# Patient Record
Sex: Male | Born: 1960 | Race: White | Hispanic: No | Marital: Married | Smoking: Never smoker
Health system: Southern US, Community
[De-identification: ages and names within clinical notes are randomized; demographics above are authoritative.]

## PROBLEM LIST (undated history)

## (undated) DIAGNOSIS — F329 Major depressive disorder, single episode, unspecified: Secondary | ICD-10-CM

## (undated) DIAGNOSIS — F32A Depression, unspecified: Secondary | ICD-10-CM

## (undated) HISTORY — PX: WISDOM TOOTH EXTRACTION: SHX21

## (undated) HISTORY — DX: Depression, unspecified: F32.A

## (undated) HISTORY — PX: VASECTOMY: SHX75

## (undated) HISTORY — DX: Major depressive disorder, single episode, unspecified: F32.9

---

## 1998-09-01 ENCOUNTER — Encounter: Payer: Self-pay | Admitting: Family Medicine

## 1998-09-01 ENCOUNTER — Ambulatory Visit (HOSPITAL_COMMUNITY): Admission: RE | Admit: 1998-09-01 | Discharge: 1998-09-01 | Payer: Self-pay | Admitting: Family Medicine

## 2003-08-28 HISTORY — PX: LASIK: SHX215

## 2004-04-12 ENCOUNTER — Ambulatory Visit (HOSPITAL_COMMUNITY): Admission: RE | Admit: 2004-04-12 | Discharge: 2004-04-12 | Payer: Self-pay | Admitting: Family Medicine

## 2005-04-06 IMAGING — US US SCROTUM
1 series · 14 of 25 positions shown · non-contrast
Comparison: none

CLINICAL DATA: Right scrotal mass.  History of vasectomy 3 years ago. 
 SCROTAL ULTRASOUND
 No comparison. 
 Both testes are normal in size and echogenicity.  The right testis measures 4.8 x 2.8 x 3.2 cm and the left testis 4.0 x 2.6 x 3.6 cm.  There is normal blood flow within both testes on color Doppler.  Prominent rete testis is noted incidentally on the right.  Both epididymides appear normal.  There are small right greater than left hydroceles.  Within the right testis, there is a small calcification measuring up to 3.5 mm in diameter.  This corresponds with the patient's palpable concern.  Small right-sided varicocele is noted incidentally.  
 IMPRESSION
 Right scrotal calcification (scrotal pearl) corresponds with the patient's palpable concern.   There are small right greater than left hydroceles.
 Both testes appear normal.

[Series 1: unknown · 0.09mm/px · 14 of 56 slices shown]
[im 1/56]
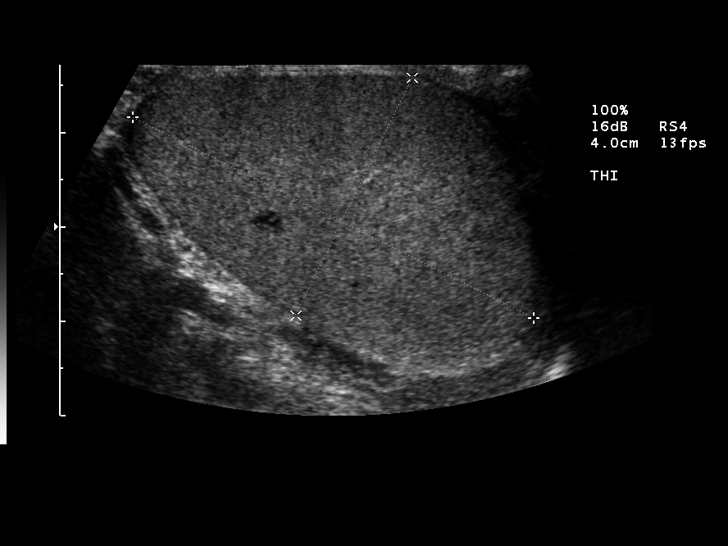
[im 5/56]
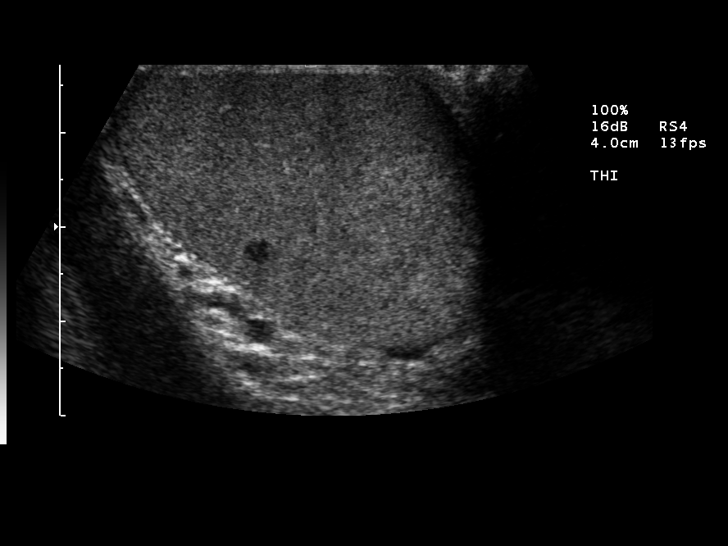
[im 10/56]
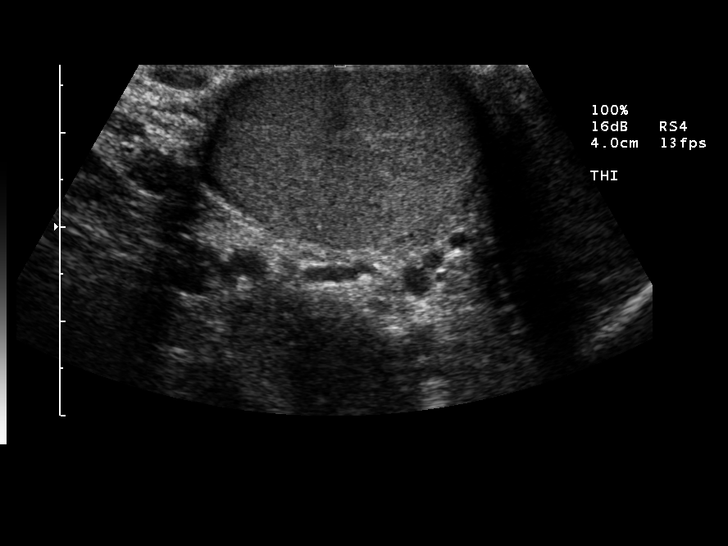
[im 14/56]
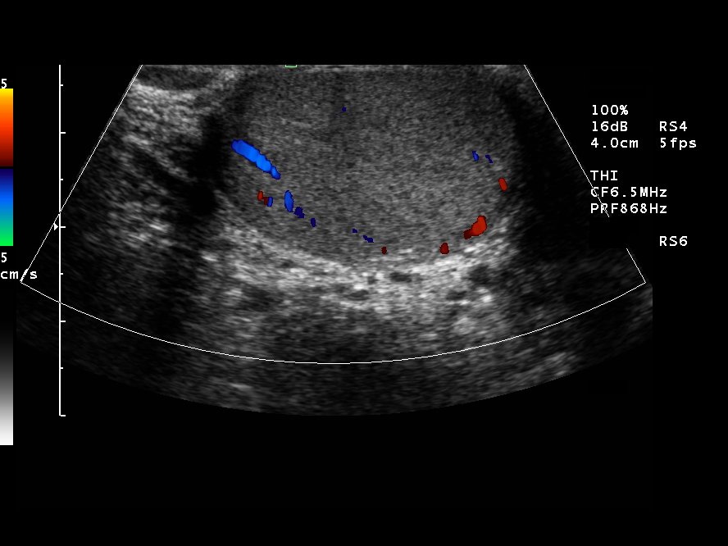
[im 19/56]
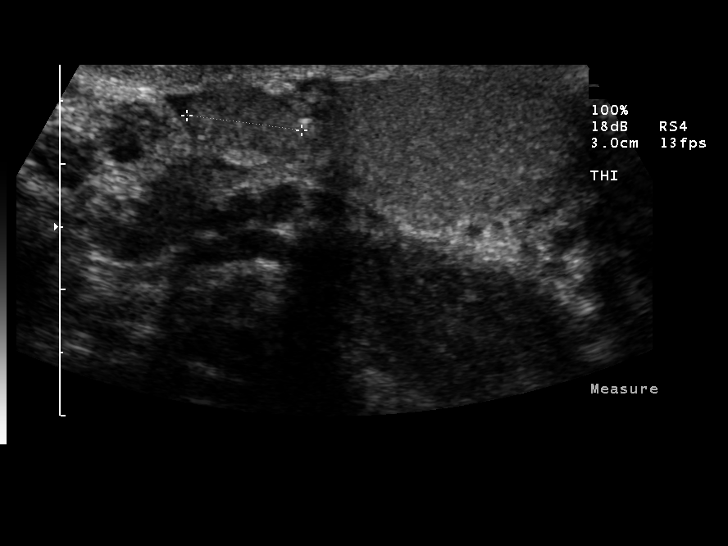
[im 21/56]
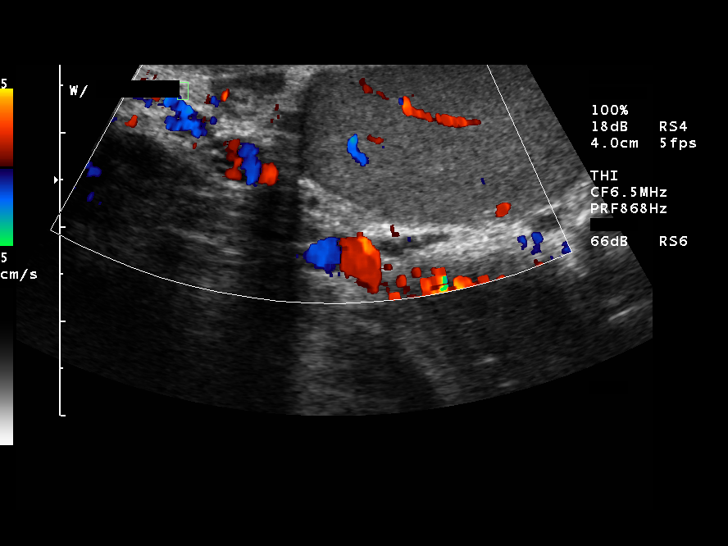
[im 26/56]
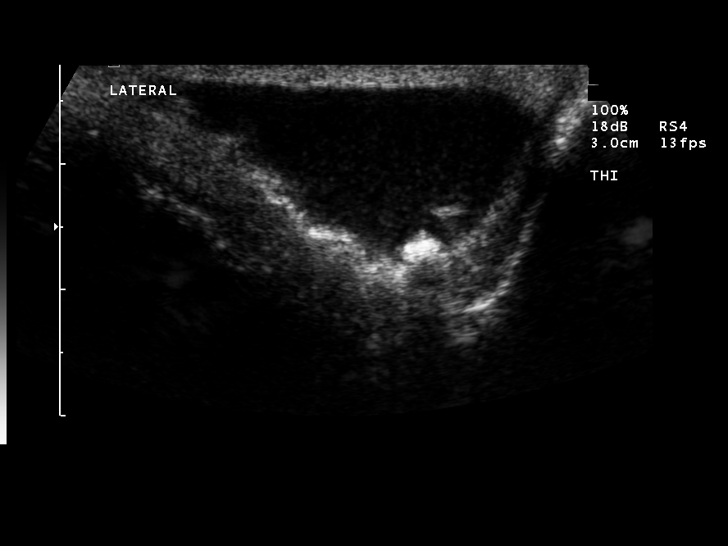
[im 30/56]
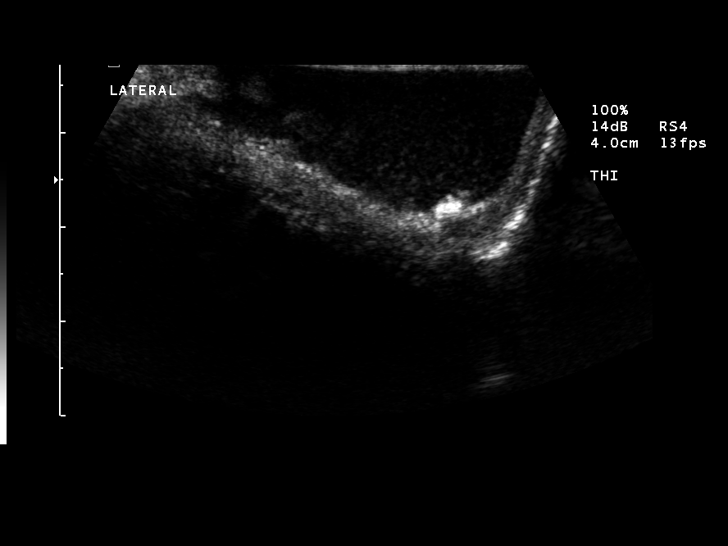
[im 35/56]
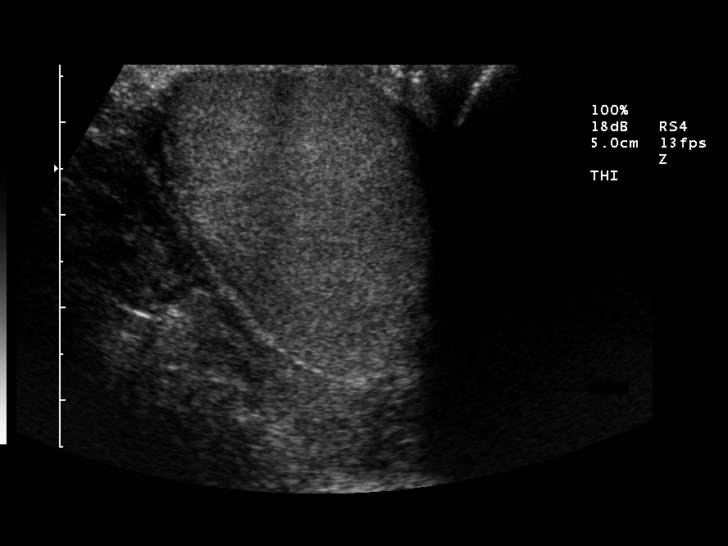
[im 37/56]
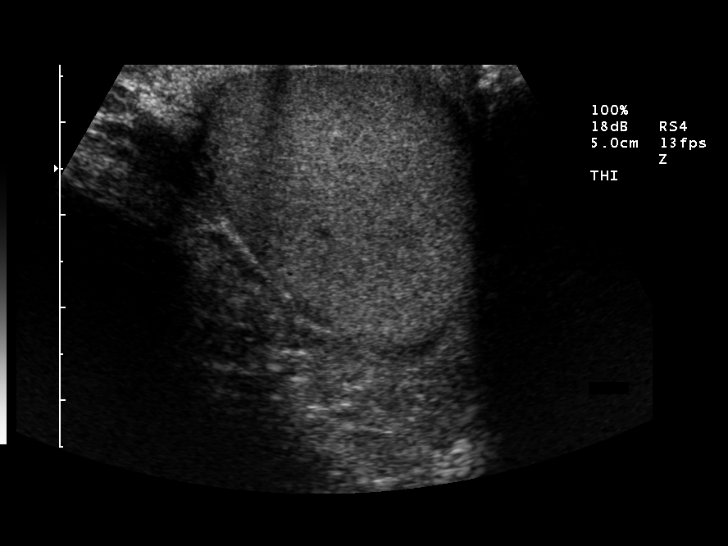
[im 42/56]
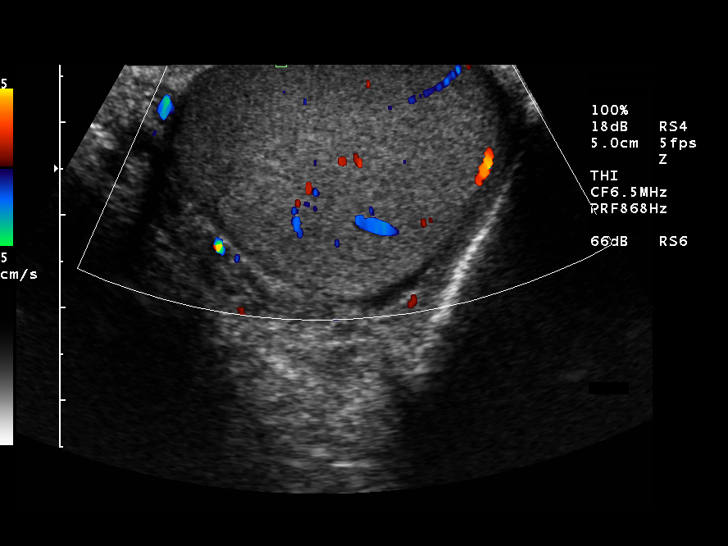
[im 46/56]
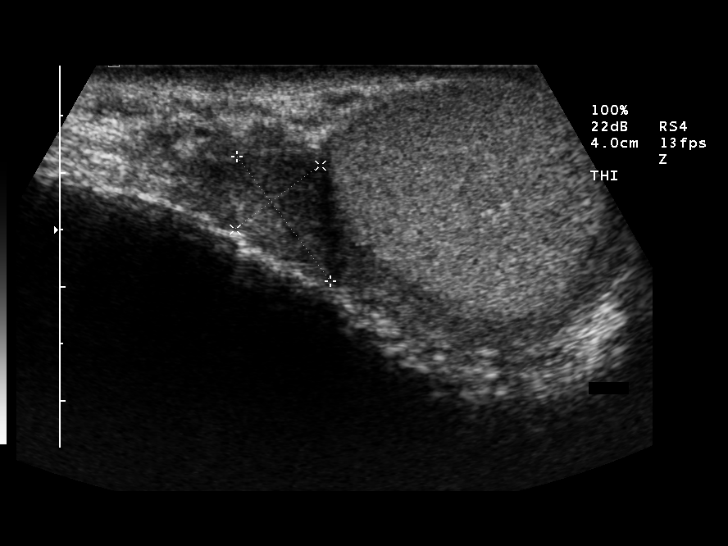
[im 51/56]
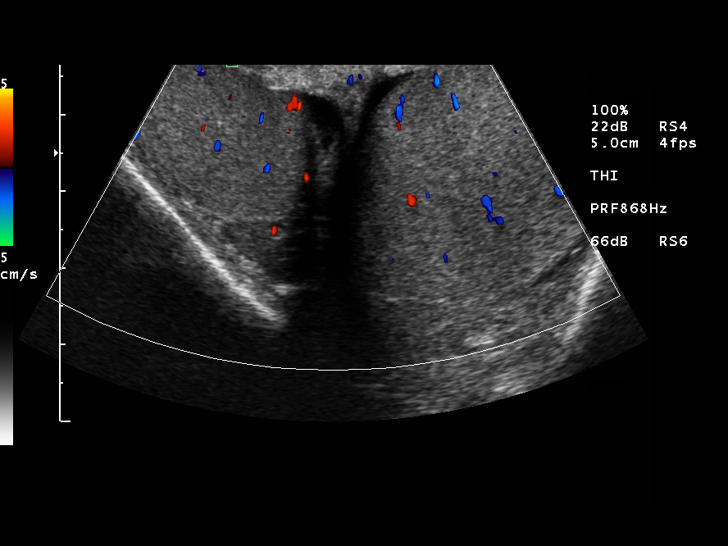
[im 56/56]
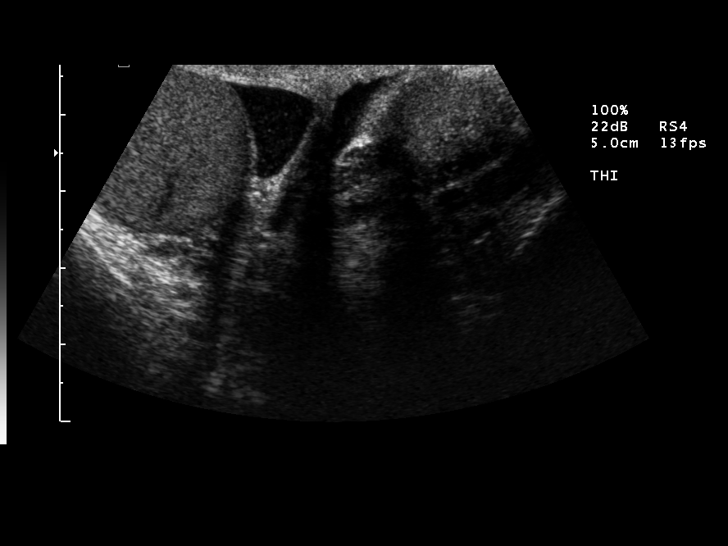

[14 of 25 positions shown; findings below may reference images not displayed]

## 2009-08-19 IMAGING — CR DG FINGERS 2V UNILAT - NO REPORT
1 series · 3 of 3 positions shown · non-contrast
Comparison: NONE

CLINICAL DATA: Splinter for 3 weeks. Evaluate for cellulitis. 
Pain  and swelling. 

RIGHT FOURTH FINGER

[Series 1: view not recorded · 0.17mm/px · 3 of 3 slices shown]
[im 1/3]
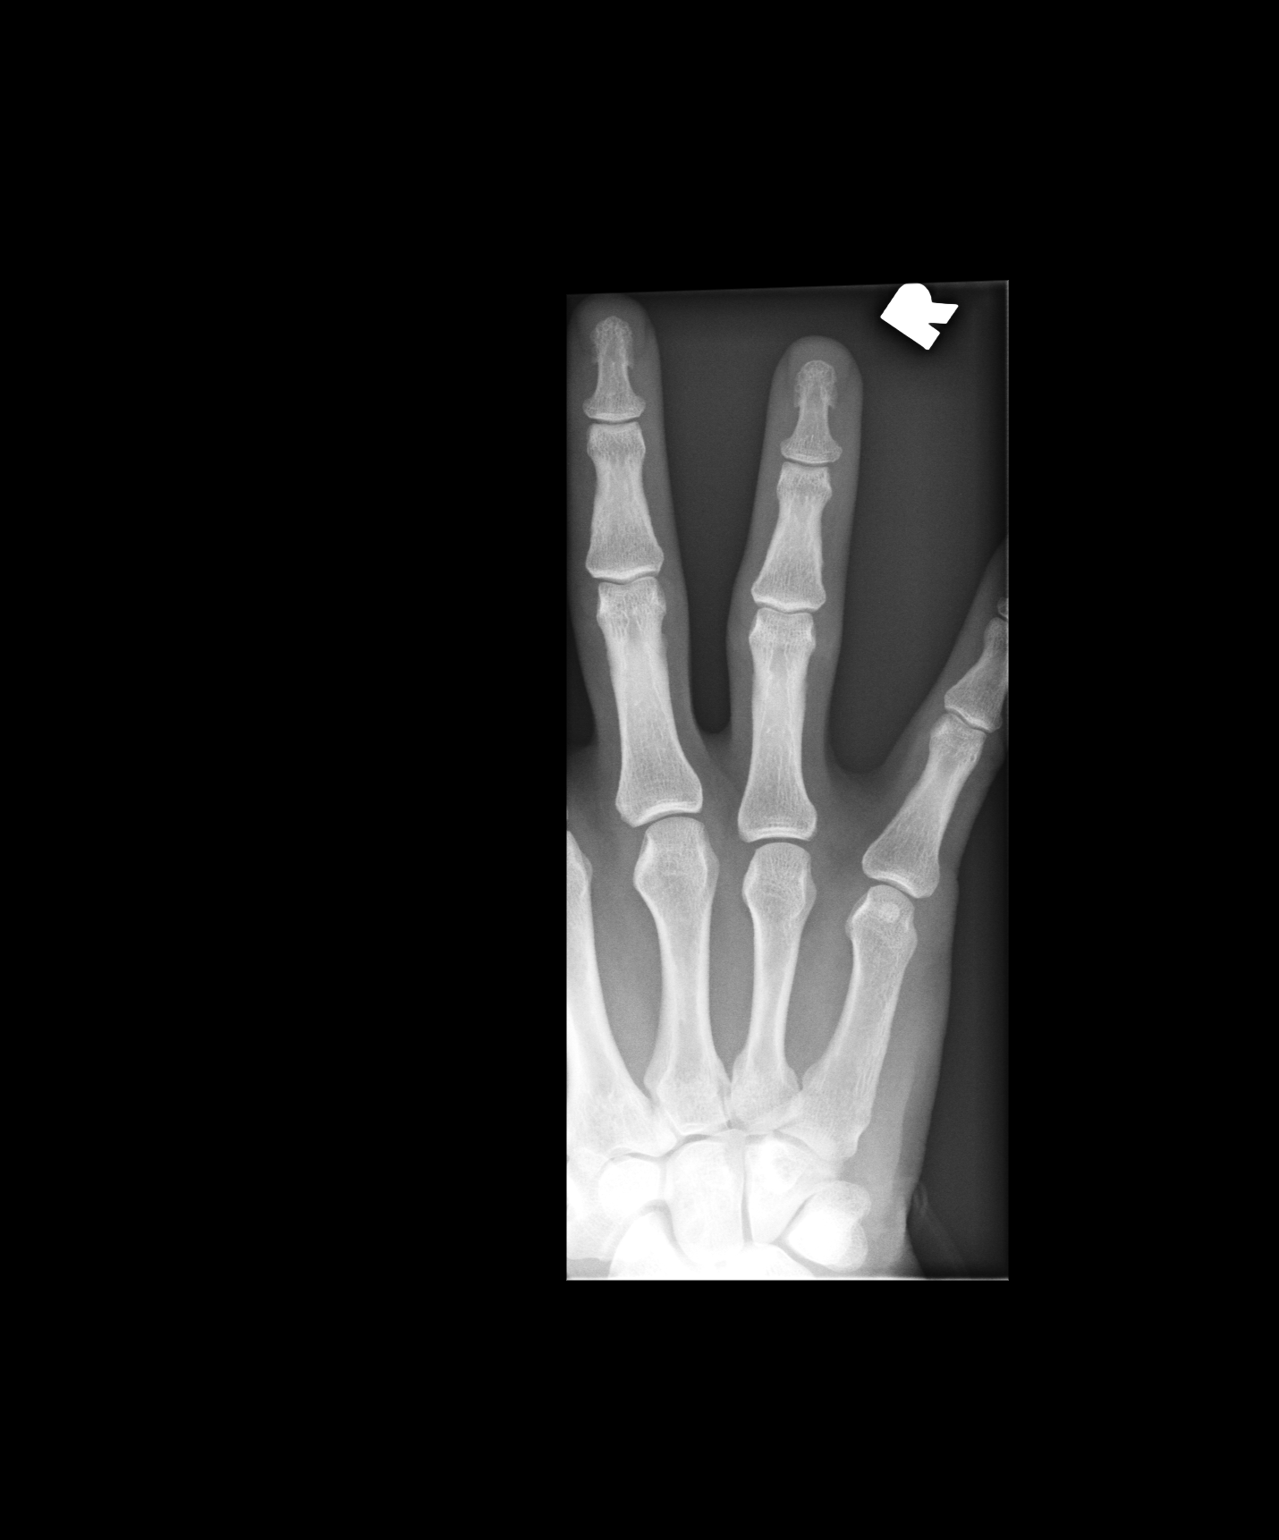
[im 2/3]
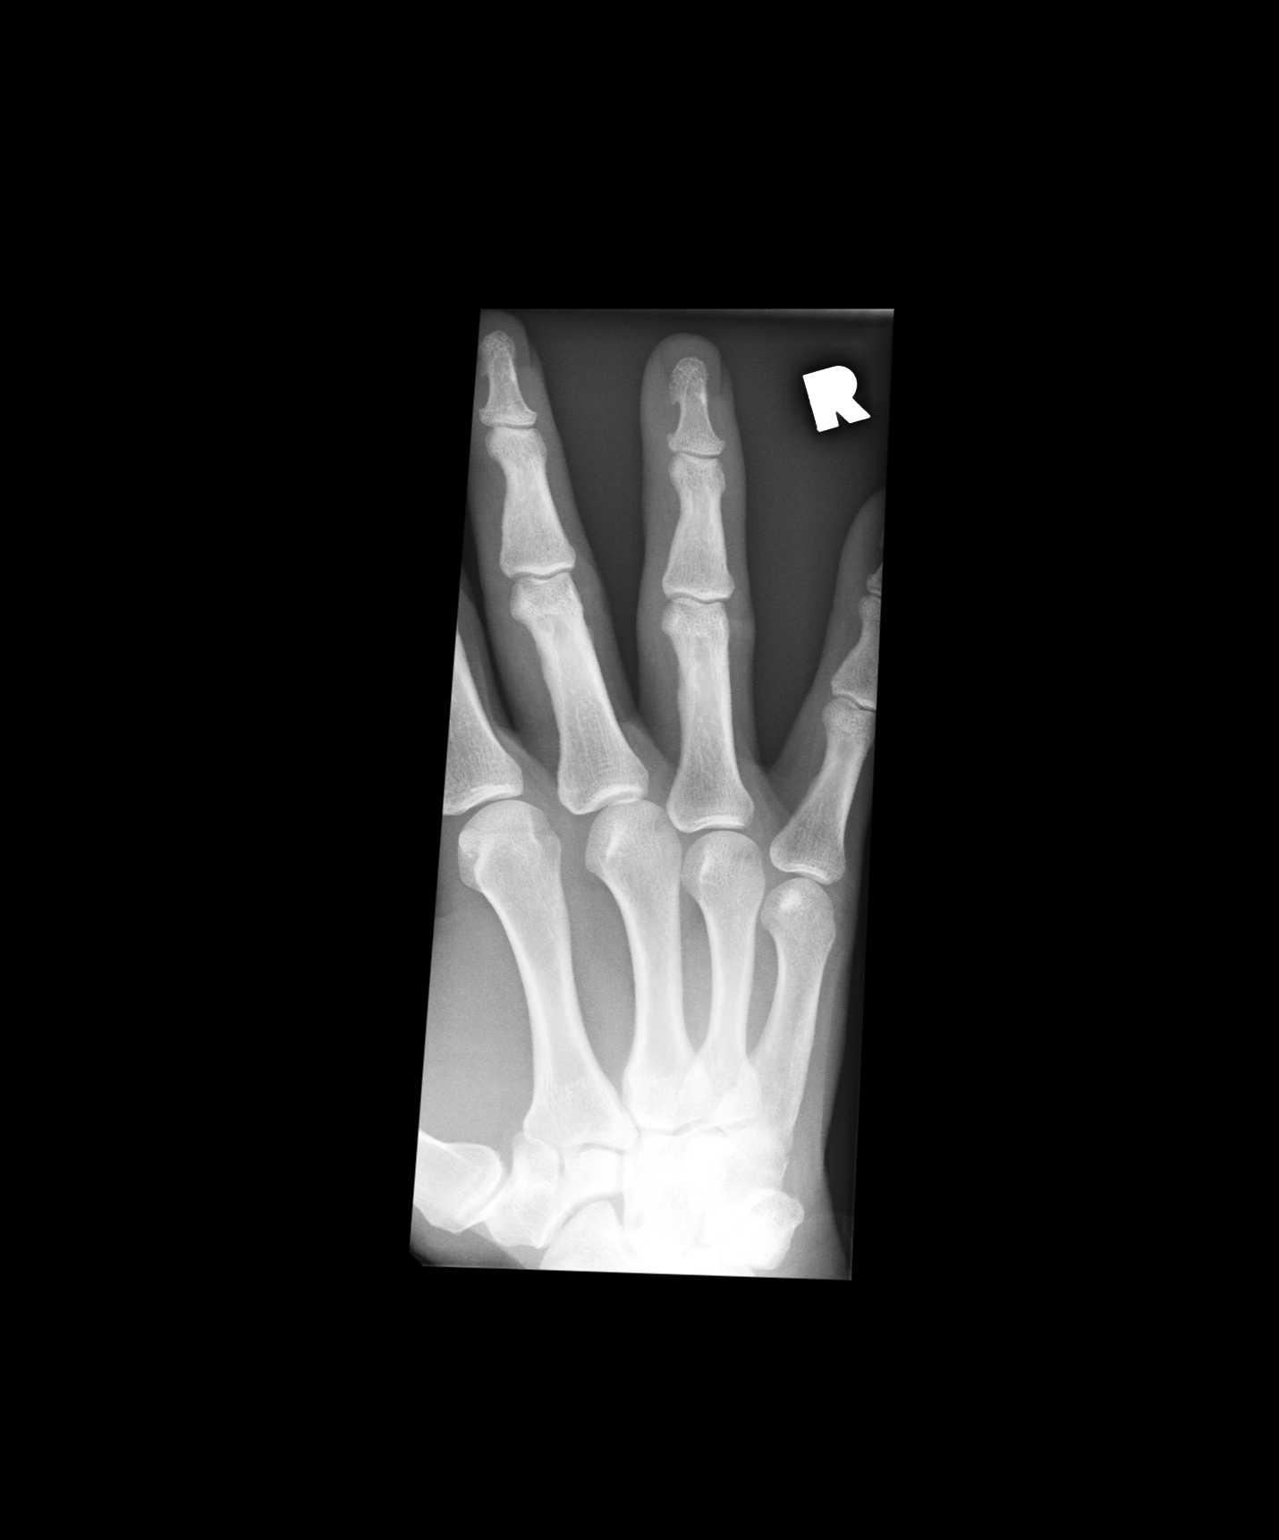
[im 3/3]
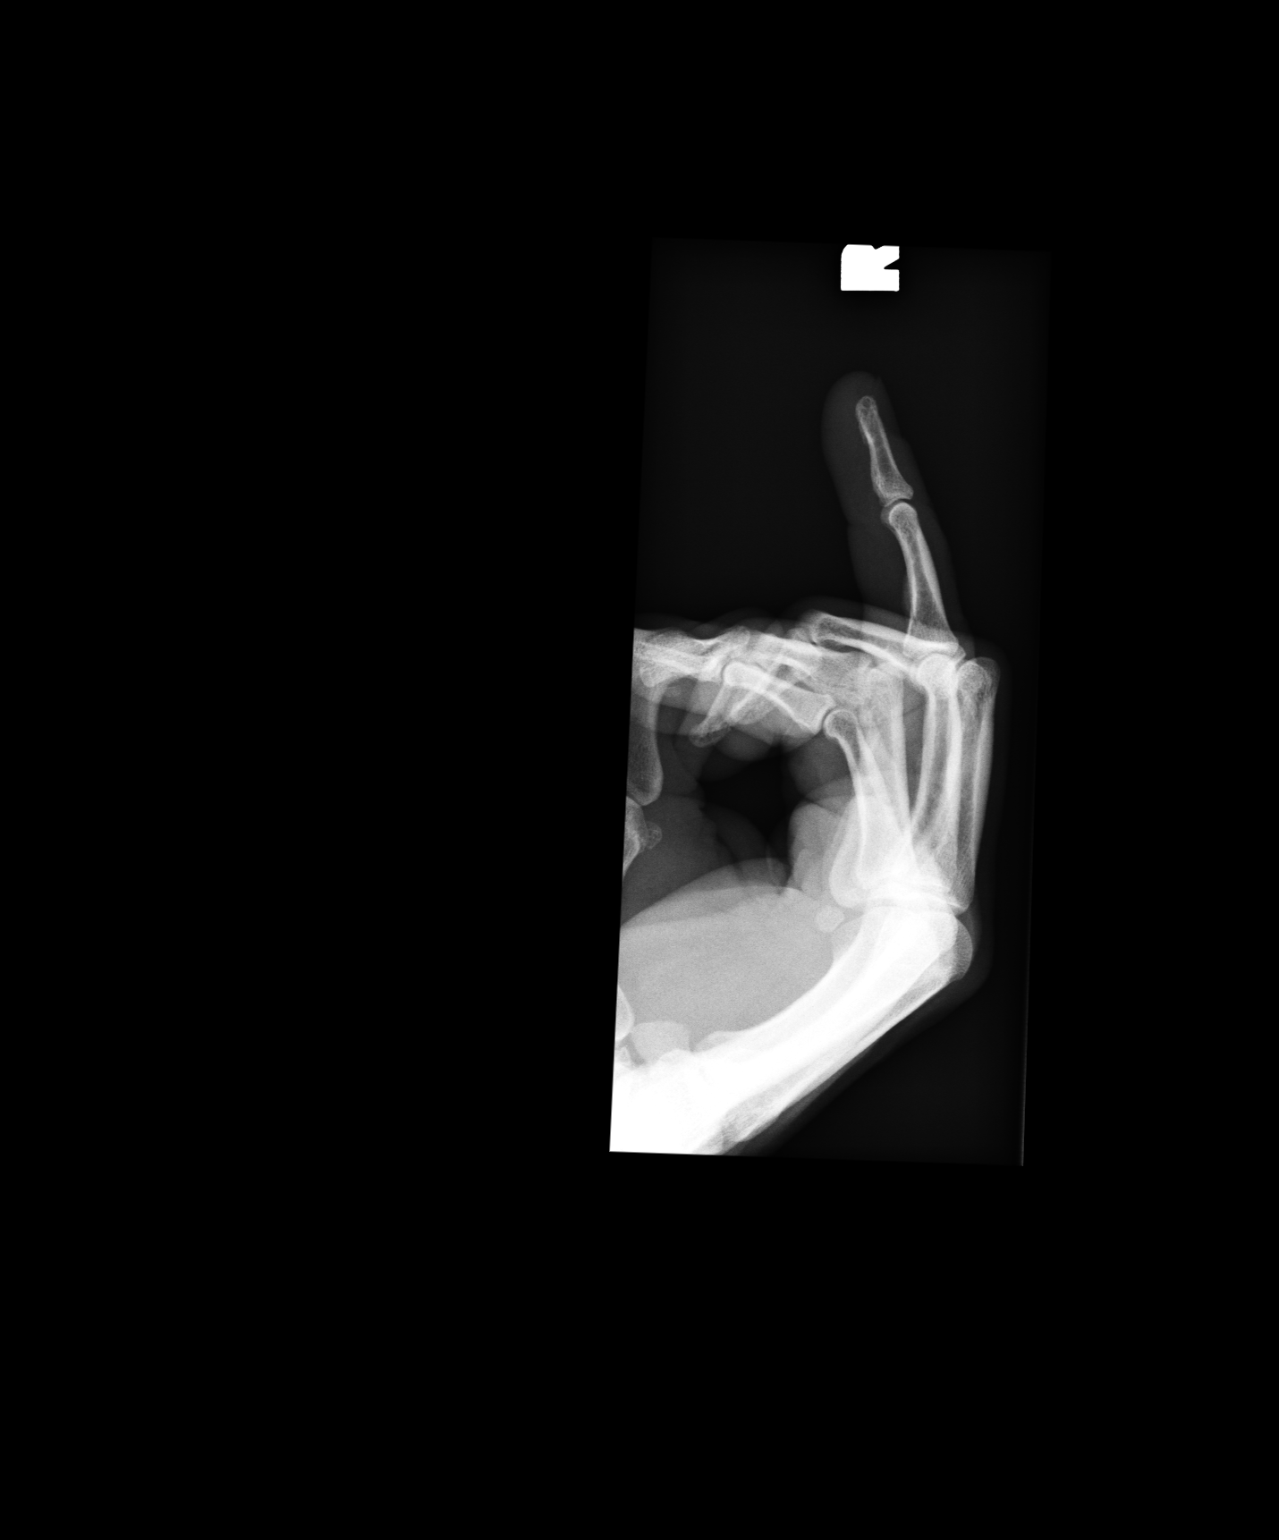

[3 of 3 positions shown; findings below may reference images not displayed]

FINDINGS: Views of the right fourth finger demonstrate no 
evidence of fracture, dislocation, soft tissue abnormality, or 
changes suggesting erosive or degenerative arthritis.
IMPRESSION: Normal. No radiopaque foreign body is identified. 
Date: 08/26/2008  Tran Date:  08/26/2008 DAS  [REDACTED]

## 2013-07-17 ENCOUNTER — Encounter: Payer: Self-pay | Admitting: Podiatrist

## 2013-07-17 ENCOUNTER — Ambulatory Visit (INDEPENDENT_AMBULATORY_CARE_PROVIDER_SITE_OTHER): Payer: Federal, State, Local not specified - PPO | Admitting: Podiatrist

## 2013-07-17 VITALS — BP 113/72 | HR 58 | Resp 16 | Ht 70.0 in | Wt 165.0 lb

## 2013-07-17 DIAGNOSIS — B351 Tinea unguium: Secondary | ICD-10-CM

## 2013-07-17 NOTE — Progress Notes (Addendum)
  MRN: [id number] Name: Dakota Flores  Sex: male Age: 52 y.o. DOB: 03-May-1961  Provider: Marlowe Aschoff P  Allergies: Review of patient's allergies indicates no known allergies.   Chief Complaint  Patient presents with  . Nail Problem    Est Pt 07/16/13 LOV  Fungal Nails B/L;  "Toenail fungus, I just can't seem to get rid of it."     HPI: Patient is 52 y.o. male who presents today for toenail fungus bilateral. Patient states "I have this toenail fungus and just can't seem to get rid of it" patient states he was on four months of Lamisil in the past and it failed to resolve the symptomatic onychomycosis.   Past Medical History  Diagnosis Date  . Depression        Medication List       This list is accurate as of: 07/17/13 11:59 PM.  Always use your most recent med list.               sertraline 50 MG tablet  Commonly known as:  ZOLOFT  Take 50 mg by mouth daily.         Past Surgical History  Procedure Laterality Date  . Vasectomy    . Wisdom tooth extraction         Review of Systems  DATA OBTAINED: from patient intake form GENERAL: Feels well no fevers, no fatigue, no changes in appetite SKIN: No itching, no rashes, no open lesions, no wounds EYES: No eye pain,no redness, no discharge EARS: No earache,no ringing of ears, no recent change in hearing NOSE: No congestion, no drainage, no bleeding  MOUTH/THROAT: No mouth pain, No sore throat, No difficulty chewing or swallowing  RESPIRATORY: No cough, no wheezing, no SOB CARDIAC: No chest pain,no heart palpitations,no new onset lower extremity edema  GI: No abdominal pain, No Nausea, no vomiting, no diarrhea, no heartburn or no reflux  GU: No dysuria, no increased frequency or urgency MUSCULOSKELETAL: No unrelieved bone/joint pain,  NEUROLOGIC: Awake, alert, appropriate to situation, No change in mental status. PSYCHIATRIC: No overt anxiety or sadness.No behavior issue.  AMBULATION:  Ambulates  unassisted  Filed Vitals:   07/17/13 1535  BP: 113/72  Pulse: 58  Resp: 16    Physical Exam  GENERAL APPEARANCE: Alert, conversant. Appropriately groomed. No acute distress.  VASCULAR: Pedal pulses palpable and strong bilateral.  Capillary refill time is immediate to all digits,  Proximal to distal cooling it warm to warm.  Digital hair growth is present bilateral  NEUROLOGIC: sensation is intact epicritically and protectively to 5.07 monofilament at 5/5 sites bilateral.  Light touch is intact bilateral, vibratory sensation intact bilateral, achilles tendon reflex is intact bilateral.  MUSCULOSKELETAL: acceptable muscle strength, tone and stability bilateral.  Intrinsic muscluature intact bilateral.  Rectus appearance of foot and digits noted bilateral.   DERMATOLOGIC: skin color, texture, and turger are within normal limits. Patient's toenails are symptomatic with onychomycosis present. Yellowish brownish discoloration and thickening of the nail is seen. Superficial white fungus appears to be present.   Assessment  Superficial onychomycosis   Plan Sample of the nail was in the [facility]. I am starting him on Jublia until we get the results of the culture. We'll notify him of the results and we'll most likely put him on onmel or Diflucan.   Delories Heinz, DPM

## 2013-07-17 NOTE — Patient Instructions (Signed)
We will contact you with the results of your culture to decide on treatment options.  untli then, use the jublia.

## 2013-07-20 ENCOUNTER — Telehealth: Payer: Self-pay | Admitting: *Deleted

## 2013-07-20 MED ORDER — EFINACONAZOLE 10 % EX SOLN: 1.0000 [drp] | CUTANEOUS | Status: DC

## 2013-07-20 NOTE — Telephone Encounter (Signed)
B/L1st toenail fragments sent to [facility] [phone] for histopathology PAS,GMS,FM and culture in addition to other stains.

## 2013-07-20 NOTE — Telephone Encounter (Signed)
Pt states he would like the rx Jublia called in.  Dr Faylene Million notes state she will prescribe Jublia if requested.  I will order the Jublia from [facility] and inform pt to activate the card.  Done.

## 2013-09-07 ENCOUNTER — Telehealth: Payer: Self-pay | Admitting: *Deleted

## 2013-09-07 NOTE — Telephone Encounter (Signed)
Left message that the nail cultures were negative.  Dr.Egerton states continue the Jublia for 6 months, if no improvement may want to talk about an oral antifungal medication, in case of a false negative.

## 2013-09-24 ENCOUNTER — Telehealth: Payer: Self-pay | Admitting: *Deleted

## 2013-09-24 NOTE — Telephone Encounter (Signed)
Left message with DR Egerton's statement of negative fungal results, may stem from Lamisil use, can continue Jublia for 1 - 2 months check progress, or use Omnel, which requires Liver Function and CBC with Diff prior to treatment.

## 2013-09-25 ENCOUNTER — Encounter: Payer: Self-pay | Admitting: Podiatrist

## 2016-07-04 DIAGNOSIS — H25013 Cortical age-related cataract, bilateral: Secondary | ICD-10-CM | POA: Diagnosis not present

## 2016-07-04 DIAGNOSIS — H2513 Age-related nuclear cataract, bilateral: Secondary | ICD-10-CM | POA: Diagnosis not present

## 2016-07-04 DIAGNOSIS — H40013 Open angle with borderline findings, low risk, bilateral: Secondary | ICD-10-CM | POA: Diagnosis not present

## 2016-07-04 DIAGNOSIS — H43393 Other vitreous opacities, bilateral: Secondary | ICD-10-CM | POA: Diagnosis not present

## 2016-07-12 DIAGNOSIS — R52 Pain, unspecified: Secondary | ICD-10-CM | POA: Diagnosis not present

## 2016-07-12 DIAGNOSIS — J111 Influenza due to unidentified influenza virus with other respiratory manifestations: Secondary | ICD-10-CM | POA: Diagnosis not present

## 2016-12-11 DIAGNOSIS — M461 Sacroiliitis, not elsewhere classified: Secondary | ICD-10-CM | POA: Diagnosis not present

## 2016-12-11 DIAGNOSIS — M9905 Segmental and somatic dysfunction of pelvic region: Secondary | ICD-10-CM | POA: Diagnosis not present

## 2016-12-11 DIAGNOSIS — M5135 Other intervertebral disc degeneration, thoracolumbar region: Secondary | ICD-10-CM | POA: Diagnosis not present

## 2016-12-11 DIAGNOSIS — M9902 Segmental and somatic dysfunction of thoracic region: Secondary | ICD-10-CM | POA: Diagnosis not present

## 2016-12-17 DIAGNOSIS — M545 Low back pain: Secondary | ICD-10-CM | POA: Diagnosis not present

## 2016-12-26 DIAGNOSIS — M545 Low back pain: Secondary | ICD-10-CM | POA: Diagnosis not present

## 2016-12-27 DIAGNOSIS — M461 Sacroiliitis, not elsewhere classified: Secondary | ICD-10-CM | POA: Diagnosis not present

## 2016-12-27 DIAGNOSIS — M9902 Segmental and somatic dysfunction of thoracic region: Secondary | ICD-10-CM | POA: Diagnosis not present

## 2016-12-27 DIAGNOSIS — M5135 Other intervertebral disc degeneration, thoracolumbar region: Secondary | ICD-10-CM | POA: Diagnosis not present

## 2016-12-27 DIAGNOSIS — M9905 Segmental and somatic dysfunction of pelvic region: Secondary | ICD-10-CM | POA: Diagnosis not present

## 2017-01-01 DIAGNOSIS — H40013 Open angle with borderline findings, low risk, bilateral: Secondary | ICD-10-CM | POA: Diagnosis not present

## 2017-01-01 DIAGNOSIS — H524 Presbyopia: Secondary | ICD-10-CM | POA: Diagnosis not present

## 2017-03-20 DIAGNOSIS — L821 Other seborrheic keratosis: Secondary | ICD-10-CM | POA: Diagnosis not present

## 2017-03-20 DIAGNOSIS — L814 Other melanin hyperpigmentation: Secondary | ICD-10-CM | POA: Diagnosis not present

## 2017-03-20 DIAGNOSIS — D225 Melanocytic nevi of trunk: Secondary | ICD-10-CM | POA: Diagnosis not present

## 2017-03-20 DIAGNOSIS — D1801 Hemangioma of skin and subcutaneous tissue: Secondary | ICD-10-CM | POA: Diagnosis not present

## 2017-05-21 DIAGNOSIS — K080 Exfoliation of teeth due to systemic causes: Secondary | ICD-10-CM | POA: Diagnosis not present

## 2017-05-27 DIAGNOSIS — R631 Polydipsia: Secondary | ICD-10-CM | POA: Diagnosis not present

## 2017-05-27 DIAGNOSIS — F419 Anxiety disorder, unspecified: Secondary | ICD-10-CM | POA: Diagnosis not present

## 2017-05-27 DIAGNOSIS — R51 Headache: Secondary | ICD-10-CM | POA: Diagnosis not present

## 2017-05-27 DIAGNOSIS — Z23 Encounter for immunization: Secondary | ICD-10-CM | POA: Diagnosis not present

## 2017-06-10 DIAGNOSIS — F418 Other specified anxiety disorders: Secondary | ICD-10-CM | POA: Diagnosis not present

## 2017-07-09 DIAGNOSIS — H2513 Age-related nuclear cataract, bilateral: Secondary | ICD-10-CM | POA: Diagnosis not present

## 2017-07-09 DIAGNOSIS — H25013 Cortical age-related cataract, bilateral: Secondary | ICD-10-CM | POA: Diagnosis not present

## 2017-07-09 DIAGNOSIS — H43393 Other vitreous opacities, bilateral: Secondary | ICD-10-CM | POA: Diagnosis not present

## 2017-07-09 DIAGNOSIS — H40013 Open angle with borderline findings, low risk, bilateral: Secondary | ICD-10-CM | POA: Diagnosis not present

## 2017-07-10 DIAGNOSIS — Z125 Encounter for screening for malignant neoplasm of prostate: Secondary | ICD-10-CM | POA: Diagnosis not present

## 2017-07-10 DIAGNOSIS — E7800 Pure hypercholesterolemia, unspecified: Secondary | ICD-10-CM | POA: Diagnosis not present

## 2017-07-12 DIAGNOSIS — F418 Other specified anxiety disorders: Secondary | ICD-10-CM | POA: Diagnosis not present

## 2017-07-12 DIAGNOSIS — F5232 Male orgasmic disorder: Secondary | ICD-10-CM | POA: Diagnosis not present

## 2017-07-12 DIAGNOSIS — Z0000 Encounter for general adult medical examination without abnormal findings: Secondary | ICD-10-CM | POA: Diagnosis not present

## 2017-07-22 DIAGNOSIS — F331 Major depressive disorder, recurrent, moderate: Secondary | ICD-10-CM | POA: Diagnosis not present

## 2017-08-27 HISTORY — PX: COLONOSCOPY: SHX174

## 2017-08-30 DIAGNOSIS — N522 Drug-induced erectile dysfunction: Secondary | ICD-10-CM | POA: Diagnosis not present

## 2017-08-31 DIAGNOSIS — F330 Major depressive disorder, recurrent, mild: Secondary | ICD-10-CM | POA: Diagnosis not present

## 2017-09-05 DIAGNOSIS — N522 Drug-induced erectile dysfunction: Secondary | ICD-10-CM | POA: Diagnosis not present

## 2017-09-18 DIAGNOSIS — Z86010 Personal history of colonic polyps: Secondary | ICD-10-CM | POA: Diagnosis not present

## 2017-10-11 DIAGNOSIS — F418 Other specified anxiety disorders: Secondary | ICD-10-CM | POA: Diagnosis not present

## 2017-10-11 DIAGNOSIS — F5232 Male orgasmic disorder: Secondary | ICD-10-CM | POA: Diagnosis not present

## 2017-10-15 DIAGNOSIS — N522 Drug-induced erectile dysfunction: Secondary | ICD-10-CM | POA: Diagnosis not present

## 2017-10-28 DIAGNOSIS — D485 Neoplasm of uncertain behavior of skin: Secondary | ICD-10-CM | POA: Diagnosis not present

## 2017-11-19 DIAGNOSIS — K080 Exfoliation of teeth due to systemic causes: Secondary | ICD-10-CM | POA: Diagnosis not present

## 2017-12-02 DIAGNOSIS — L821 Other seborrheic keratosis: Secondary | ICD-10-CM | POA: Diagnosis not present

## 2017-12-02 DIAGNOSIS — I781 Nevus, non-neoplastic: Secondary | ICD-10-CM | POA: Diagnosis not present

## 2018-01-21 DIAGNOSIS — M25511 Pain in right shoulder: Secondary | ICD-10-CM | POA: Diagnosis not present

## 2018-04-16 DIAGNOSIS — M25511 Pain in right shoulder: Secondary | ICD-10-CM | POA: Diagnosis not present

## 2018-05-14 DIAGNOSIS — D225 Melanocytic nevi of trunk: Secondary | ICD-10-CM | POA: Diagnosis not present

## 2018-05-14 DIAGNOSIS — D1801 Hemangioma of skin and subcutaneous tissue: Secondary | ICD-10-CM | POA: Diagnosis not present

## 2018-05-14 DIAGNOSIS — L821 Other seborrheic keratosis: Secondary | ICD-10-CM | POA: Diagnosis not present

## 2018-05-14 DIAGNOSIS — L814 Other melanin hyperpigmentation: Secondary | ICD-10-CM | POA: Diagnosis not present

## 2018-05-28 DIAGNOSIS — K080 Exfoliation of teeth due to systemic causes: Secondary | ICD-10-CM | POA: Diagnosis not present

## 2018-07-01 DIAGNOSIS — Z23 Encounter for immunization: Secondary | ICD-10-CM | POA: Diagnosis not present

## 2018-07-31 DIAGNOSIS — H25013 Cortical age-related cataract, bilateral: Secondary | ICD-10-CM | POA: Diagnosis not present

## 2018-07-31 DIAGNOSIS — H2513 Age-related nuclear cataract, bilateral: Secondary | ICD-10-CM | POA: Diagnosis not present

## 2018-07-31 DIAGNOSIS — H40013 Open angle with borderline findings, low risk, bilateral: Secondary | ICD-10-CM | POA: Diagnosis not present

## 2018-07-31 DIAGNOSIS — H43812 Vitreous degeneration, left eye: Secondary | ICD-10-CM | POA: Diagnosis not present

## 2018-09-04 DIAGNOSIS — Z125 Encounter for screening for malignant neoplasm of prostate: Secondary | ICD-10-CM | POA: Diagnosis not present

## 2018-09-04 DIAGNOSIS — E7800 Pure hypercholesterolemia, unspecified: Secondary | ICD-10-CM | POA: Diagnosis not present

## 2018-09-11 DIAGNOSIS — Z23 Encounter for immunization: Secondary | ICD-10-CM | POA: Diagnosis not present

## 2018-09-11 DIAGNOSIS — R42 Dizziness and giddiness: Secondary | ICD-10-CM | POA: Diagnosis not present

## 2018-09-11 DIAGNOSIS — Z0000 Encounter for general adult medical examination without abnormal findings: Secondary | ICD-10-CM | POA: Diagnosis not present

## 2018-10-17 ENCOUNTER — Encounter: Payer: Self-pay | Admitting: Cardiology

## 2018-10-22 ENCOUNTER — Encounter: Payer: Self-pay | Admitting: Cardiology

## 2018-10-22 ENCOUNTER — Encounter (INDEPENDENT_AMBULATORY_CARE_PROVIDER_SITE_OTHER): Payer: Self-pay

## 2018-10-22 ENCOUNTER — Ambulatory Visit: Payer: Federal, State, Local not specified - PPO | Admitting: Cardiology

## 2018-10-22 VITALS — Ht 70.0 in | Wt 168.8 lb

## 2018-10-22 DIAGNOSIS — R42 Dizziness and giddiness: Secondary | ICD-10-CM

## 2018-10-22 DIAGNOSIS — Z7189 Other specified counseling: Secondary | ICD-10-CM

## 2018-10-22 NOTE — Patient Instructions (Signed)
Medication Instructions:  Your Physician recommend you continue on your current medication as directed.    If you need a refill on your cardiac medications before your next appointment, please call your pharmacy.   Lab work: None  Testing/Procedures: None  Follow-Up: At [facility], you and your health needs are our priority.  As part of our continuing mission to provide you with exceptional heart care, we have created designated Provider Care Teams.  These Care Teams include your primary Cardiologist (physician) and Advanced Practice Providers (APPs -  Physician Assistants and Nurse Practitioners) who all work together to provide you with the care you need, when you need it. You will need a follow up appointment as needed.  Please call our office 2 months in advance to schedule this appointment.  You may see Dr. Christopher or one of the following Advanced Practice Providers on your designated Care Team:   Rhonda Barrett, PA-C . Kathryn Lawrence, DNP, ANP     

## 2018-10-22 NOTE — Progress Notes (Signed)
Cardiology Office Note:    Date:  10/23/2018   ID:  Dakota LittenSteven M Hetland, DOB 11/14/1960, MRN [id number]  PCP:  Clementeen GrahamScifres, Dorothy, PA-C  Cardiologist:  Jodelle RedBridgette Sherol Sabas, MD PhD  Referring MD: Clementeen GrahamScifres, Dorothy, PA-C   CC: lightheadedness  History of Present Illness:    Dakota LittenSteven M Flores is a 58 y.o. male without past cardiac history who is seen as a new consult at the request of Scifres, Nicole CellaDorothy, PA-C for the evaluation and management of intermittent lightheadedness.  We reviewed the timeline of events: In early January, he had two episodes when he was sitting down to eat. Within 1-2 minutes, he experienced momentary lightheadedness lasting only 2-3 seconds. He noted possibly tunnel vision versus maybe slight spinning, but no nausea/diaphoresis, no focal neuro changes, no palpitations, no chest pain/dyspnea, no cough. No GI symptoms or sensation of food sticking. No full loss of consciousness. Went away on its own. When the second event occurred, it was similar but stronger. He has had no further events like that.  He did have another episode about a month ago. He notes that he is not sure if it actually happened or it was a dream. Around 2 AM, he felt like someone was squeezing his chest from the inside out. He felt like he couldn't take a deep breath. It lasted 1-2 minutes. As he woke up out of a fig, the sensation went away. He was fine after that and the following morning. No other associated symptoms.   In general, he is very active. He plays doubles tennis without any limitations. He denies any exertional shortness of breath, no other chest discomfort. He has noted that he has felt more anxious in recent months, but otherwise he has not noticed any changes.   Denies shortness of breath at rest or with normal exertion. No PND, orthopnea, LE edema or unexpected weight gain. No syncope or palpitations.  Past Medical History:  Diagnosis Date  . Depression     Past Surgical History:    Procedure Laterality Date  . COLONOSCOPY  08/2017  . LASIK  2005  . VASECTOMY    . WISDOM TOOTH EXTRACTION      Current Medications: Current Outpatient Medications on File Prior to Visit  Medication Sig  . Efinaconazole (JUBLIA) 10 % SOLN Apply 1 drop topically 1 day or 1 dose. (Patient not taking: Reported on 10/22/2018)  . sertraline (ZOLOFT) 50 MG tablet Take 50 mg by mouth daily.   No current facility-administered medications on file prior to visit.      Allergies:   Patient has no known allergies.   Social History   Socioeconomic History  . Marital status: Married    Spouse name: Not on file  . Number of children: Not on file  . Years of education: Not on file  . Highest education level: Not on file  Occupational History  . Not on file  Social Needs  . Financial resource strain: Not on file  . Food insecurity:    Worry: Not on file    Inability: Not on file  . Transportation needs:    Medical: Not on file    Non-medical: Not on file  Tobacco Use  . Smoking status: Never Smoker  . Smokeless tobacco: Never Used  Substance and Sexual Activity  . Alcohol use: Yes    Comment: rarely  . Drug use: No  . Sexual activity: Not on file  Lifestyle  . Physical activity:    Days per week:  4 days    Minutes per session: 90 min  . Stress: Not on file  Relationships  . Social connections:    Talks on phone: Not on file    Gets together: Not on file    Attends religious service: Not on file    Active member of club or organization: Not on file    Attends meetings of clubs or organizations: Not on file    Relationship status: Not on file  Other Topics Concern  . Not on file  Social History Narrative  . Not on file     Family History: The patient's family history includes Diabetes in his father; Heart Problems in his mother; Heart disease in his brother; Hyperlipidemia in his mother; Hypertension in his mother. His mother is alive and in her 57s. No pacemaker. His  older brother has an unknown heart issue (they are not in frequent communications). He is 61.  ROS:   Please see the history of present illness.  Additional pertinent ROS:  Constitutional: Negative for chills, fever, night sweats, unintentional weight loss  HENT: Negative for ear pain and hearing loss.   Eyes: Negative for loss of vision and eye pain.  Respiratory: Negative for cough, sputum, shortness of breath, wheezing.   Cardiovascular: See HPI. Gastrointestinal: Negative for abdominal pain, melena, and hematochezia.  Genitourinary: Negative for dysuria and hematuria.  Musculoskeletal: Negative for falls and myalgias.  Skin: Negative for itching and rash.  Neurological: Negative for focal weakness, focal sensory changes and loss of consciousness.  Endo/Heme/Allergies: Does not bruise/bleed easily.    EKGs/Labs/Other Studies Reviewed:    The following studies were reviewed today: Notes from PCP   EKG:  EKG is personally reviewed.  The ekg ordered today demonstrates normal sinus rhythm, late R wave progression  Recent Labs: No results found for requested labs within last 8760 hours.  Recent Lipid Panel No results found for: CHOL, TRIG, HDL, CHOLHDL, VLDL, LDLCALC, LDLDIRECT  Physical Exam:    VS:  Ht 5\' 10"  (1.778 m)   Wt 168 lb 12.8 oz (76.6 kg)   BMI 24.22 kg/m    Orthostatics: Lying 120/74, HR 59 Sitting 128/62, HR 60 Standing 130/32, HR 61  Wt Readings from Last 3 Encounters:  10/22/18 168 lb 12.8 oz (76.6 kg)  07/17/13 165 lb (74.8 kg)     GEN: Well nourished, well developed in no acute distress HEENT: Normal NECK: No JVD; No carotid bruits LYMPHATICS: No lymphadenopathy CARDIAC: regular rhythm, normal S1 and S2, no murmurs, rubs, gallops. Radial and DP pulses 2+ bilaterally. RESPIRATORY:  Clear to auscultation without rales, wheezing or rhonchi  ABDOMEN: Soft, non-tender, non-distended MUSCULOSKELETAL:  No edema; No deformity  SKIN: Warm and  dry NEUROLOGIC:  Alert and oriented x 3 PSYCHIATRIC:  Normal affect   ASSESSMENT:    1. Lightheadedness   2. Cardiac risk counseling   3. Counseling on health promotion and disease prevention    PLAN:    Lightheadedness: no full syncope. Atypical for cardiac cause. No palpitations prior to suggest arrhythmia. No abnormalities on exam.  We spent extensive time discussing all of the potential etiologies for lightheadedness. Given limited brief symptoms, lack of full syncope, we elected to monitor at this time. If symptoms become more frequent or if he has full syncope, would then discuss echo and monitor.  Cardiac risk counseling and prevention recommendations: -recommend heart healthy/Mediterranean diet, with whole grains, fruits, vegetable, fish, lean meats, nuts, and olive oil. Limit salt. -recommend moderate walking,  3-5 times/week for 30-50 minutes each session. Aim for at least 150 minutes.week. Goal should be pace of 3 miles/hours, or walking 1.5 miles in 30 minutes -recommend avoidance of tobacco products. Avoid excess alcohol. -Additional risk factor control:  -Diabetes: A1c is 5.3, no history of diabetes  -Lipids: Tchol 139, HDL 46, LDL 85, TG 41  -Blood pressure control: at goal, on no meds  -Weight: at goal, BMI 24 -ASCVD risk score: 4.4% 10 year risk  Plan for follow up: as needed  Medication Adjustments/Labs and Tests Ordered: Current medicines are reviewed at length with the patient today.  Concerns regarding medicines are outlined above.  Orders Placed This Encounter  Procedures  . EKG 12-Lead   No orders of the defined types were placed in this encounter.   Patient Instructions  Medication Instructions:  Your Physician recommend you continue on your current medication as directed.    If you need a refill on your cardiac medications before your next appointment, please call your pharmacy.   Lab work: None  Testing/Procedures: None  Follow-Up: At [facility], you and your health needs are our priority.  As part of our continuing mission to provide you with exceptional heart care, we have created designated Provider Care Teams.  These Care Teams include your primary Cardiologist (physician) and Advanced Practice Providers (APPs -  Physician Assistants and Nurse Practitioners) who all work together to provide you with the care you need, when you need it. You will need a follow up appointment as needed   Please call our office 2 months in advance to schedule this appointment.  You may see Dr. Cristal Deer or one of the following Advanced Practice Providers on your designated Care Team:   Theodore Demark, PA-C . Joni Reining, DNP, ANP       Signed, Jodelle Red, MD PhD 10/23/2018 1:59 PM    [facility]

## 2019-01-29 DIAGNOSIS — N401 Enlarged prostate with lower urinary tract symptoms: Secondary | ICD-10-CM | POA: Diagnosis not present

## 2019-01-29 DIAGNOSIS — N522 Drug-induced erectile dysfunction: Secondary | ICD-10-CM | POA: Diagnosis not present

## 2019-01-29 DIAGNOSIS — R351 Nocturia: Secondary | ICD-10-CM | POA: Diagnosis not present

## 2019-04-17 DIAGNOSIS — Z23 Encounter for immunization: Secondary | ICD-10-CM | POA: Diagnosis not present

## 2019-04-17 DIAGNOSIS — R202 Paresthesia of skin: Secondary | ICD-10-CM | POA: Diagnosis not present

## 2019-05-28 DIAGNOSIS — D1801 Hemangioma of skin and subcutaneous tissue: Secondary | ICD-10-CM | POA: Diagnosis not present

## 2019-05-28 DIAGNOSIS — D225 Melanocytic nevi of trunk: Secondary | ICD-10-CM | POA: Diagnosis not present

## 2019-05-28 DIAGNOSIS — L821 Other seborrheic keratosis: Secondary | ICD-10-CM | POA: Diagnosis not present

## 2019-05-28 DIAGNOSIS — L814 Other melanin hyperpigmentation: Secondary | ICD-10-CM | POA: Diagnosis not present

## 2019-06-24 DIAGNOSIS — Z23 Encounter for immunization: Secondary | ICD-10-CM | POA: Diagnosis not present

## 2019-08-10 DIAGNOSIS — L309 Dermatitis, unspecified: Secondary | ICD-10-CM | POA: Diagnosis not present

## 2019-09-16 DIAGNOSIS — Z125 Encounter for screening for malignant neoplasm of prostate: Secondary | ICD-10-CM | POA: Diagnosis not present

## 2019-09-16 DIAGNOSIS — Z0000 Encounter for general adult medical examination without abnormal findings: Secondary | ICD-10-CM | POA: Diagnosis not present

## 2019-09-16 DIAGNOSIS — Z03818 Encounter for observation for suspected exposure to other biological agents ruled out: Secondary | ICD-10-CM | POA: Diagnosis not present

## 2019-09-16 DIAGNOSIS — Z13220 Encounter for screening for lipoid disorders: Secondary | ICD-10-CM | POA: Diagnosis not present

## 2019-09-16 DIAGNOSIS — J069 Acute upper respiratory infection, unspecified: Secondary | ICD-10-CM | POA: Diagnosis not present

## 2019-12-28 DIAGNOSIS — H6121 Impacted cerumen, right ear: Secondary | ICD-10-CM | POA: Diagnosis not present

## 2019-12-28 DIAGNOSIS — F331 Major depressive disorder, recurrent, moderate: Secondary | ICD-10-CM | POA: Diagnosis not present

## 2019-12-28 DIAGNOSIS — F411 Generalized anxiety disorder: Secondary | ICD-10-CM | POA: Diagnosis not present

## 2020-02-01 DIAGNOSIS — D485 Neoplasm of uncertain behavior of skin: Secondary | ICD-10-CM | POA: Diagnosis not present

## 2020-02-01 DIAGNOSIS — L821 Other seborrheic keratosis: Secondary | ICD-10-CM | POA: Diagnosis not present

## 2020-05-31 DIAGNOSIS — L814 Other melanin hyperpigmentation: Secondary | ICD-10-CM | POA: Diagnosis not present

## 2020-05-31 DIAGNOSIS — Z86018 Personal history of other benign neoplasm: Secondary | ICD-10-CM | POA: Diagnosis not present

## 2020-05-31 DIAGNOSIS — L821 Other seborrheic keratosis: Secondary | ICD-10-CM | POA: Diagnosis not present

## 2020-05-31 DIAGNOSIS — D225 Melanocytic nevi of trunk: Secondary | ICD-10-CM | POA: Diagnosis not present

## 2020-06-01 DIAGNOSIS — Z23 Encounter for immunization: Secondary | ICD-10-CM | POA: Diagnosis not present

## 2020-06-14 DIAGNOSIS — M1711 Unilateral primary osteoarthritis, right knee: Secondary | ICD-10-CM | POA: Diagnosis not present

## 2020-06-14 DIAGNOSIS — M25461 Effusion, right knee: Secondary | ICD-10-CM | POA: Diagnosis not present

## 2020-08-04 DIAGNOSIS — H6122 Impacted cerumen, left ear: Secondary | ICD-10-CM | POA: Diagnosis not present

## 2020-09-07 DIAGNOSIS — Z0000 Encounter for general adult medical examination without abnormal findings: Secondary | ICD-10-CM | POA: Diagnosis not present

## 2020-09-07 DIAGNOSIS — E7800 Pure hypercholesterolemia, unspecified: Secondary | ICD-10-CM | POA: Diagnosis not present

## 2020-10-04 DIAGNOSIS — F3341 Major depressive disorder, recurrent, in partial remission: Secondary | ICD-10-CM | POA: Diagnosis not present

## 2020-10-06 DIAGNOSIS — L309 Dermatitis, unspecified: Secondary | ICD-10-CM | POA: Diagnosis not present

## 2020-10-31 DIAGNOSIS — Z0000 Encounter for general adult medical examination without abnormal findings: Secondary | ICD-10-CM | POA: Diagnosis not present

## 2020-11-17 DIAGNOSIS — C44629 Squamous cell carcinoma of skin of left upper limb, including shoulder: Secondary | ICD-10-CM | POA: Diagnosis not present

## 2020-11-17 DIAGNOSIS — D485 Neoplasm of uncertain behavior of skin: Secondary | ICD-10-CM | POA: Diagnosis not present

## 2020-12-01 DIAGNOSIS — C44629 Squamous cell carcinoma of skin of left upper limb, including shoulder: Secondary | ICD-10-CM | POA: Diagnosis not present

## 2021-03-27 DIAGNOSIS — D0439 Carcinoma in situ of skin of other parts of face: Secondary | ICD-10-CM | POA: Diagnosis not present

## 2021-03-27 DIAGNOSIS — D485 Neoplasm of uncertain behavior of skin: Secondary | ICD-10-CM | POA: Diagnosis not present

## 2021-04-20 DIAGNOSIS — C4442 Squamous cell carcinoma of skin of scalp and neck: Secondary | ICD-10-CM | POA: Diagnosis not present

## 2021-05-22 DIAGNOSIS — M25511 Pain in right shoulder: Secondary | ICD-10-CM | POA: Diagnosis not present

## 2021-06-29 DIAGNOSIS — Z20822 Contact with and (suspected) exposure to covid-19: Secondary | ICD-10-CM | POA: Diagnosis not present

## 2021-06-29 DIAGNOSIS — U071 COVID-19: Secondary | ICD-10-CM | POA: Diagnosis not present

## 2021-06-29 DIAGNOSIS — R051 Acute cough: Secondary | ICD-10-CM | POA: Diagnosis not present

## 2021-06-29 DIAGNOSIS — R509 Fever, unspecified: Secondary | ICD-10-CM | POA: Diagnosis not present

## 2021-08-01 DIAGNOSIS — D225 Melanocytic nevi of trunk: Secondary | ICD-10-CM | POA: Diagnosis not present

## 2021-08-01 DIAGNOSIS — Z86018 Personal history of other benign neoplasm: Secondary | ICD-10-CM | POA: Diagnosis not present

## 2021-08-01 DIAGNOSIS — L814 Other melanin hyperpigmentation: Secondary | ICD-10-CM | POA: Diagnosis not present

## 2021-08-01 DIAGNOSIS — L821 Other seborrheic keratosis: Secondary | ICD-10-CM | POA: Diagnosis not present

## 2021-09-25 DIAGNOSIS — H40013 Open angle with borderline findings, low risk, bilateral: Secondary | ICD-10-CM | POA: Diagnosis not present

## 2021-09-25 DIAGNOSIS — H43812 Vitreous degeneration, left eye: Secondary | ICD-10-CM | POA: Diagnosis not present

## 2021-09-25 DIAGNOSIS — H25813 Combined forms of age-related cataract, bilateral: Secondary | ICD-10-CM | POA: Diagnosis not present

## 2021-10-18 DIAGNOSIS — L0231 Cutaneous abscess of buttock: Secondary | ICD-10-CM | POA: Diagnosis not present

## 2021-10-18 DIAGNOSIS — Z23 Encounter for immunization: Secondary | ICD-10-CM | POA: Diagnosis not present

## 2021-10-25 DIAGNOSIS — M79672 Pain in left foot: Secondary | ICD-10-CM | POA: Diagnosis not present

## 2021-10-27 DIAGNOSIS — Z125 Encounter for screening for malignant neoplasm of prostate: Secondary | ICD-10-CM | POA: Diagnosis not present

## 2021-10-27 DIAGNOSIS — E782 Mixed hyperlipidemia: Secondary | ICD-10-CM | POA: Diagnosis not present

## 2021-10-27 DIAGNOSIS — Z0000 Encounter for general adult medical examination without abnormal findings: Secondary | ICD-10-CM | POA: Diagnosis not present

## 2021-10-30 DIAGNOSIS — H40023 Open angle with borderline findings, high risk, bilateral: Secondary | ICD-10-CM | POA: Diagnosis not present

## 2021-10-30 DIAGNOSIS — H40022 Open angle with borderline findings, high risk, left eye: Secondary | ICD-10-CM | POA: Diagnosis not present

## 2021-11-02 DIAGNOSIS — Z0000 Encounter for general adult medical examination without abnormal findings: Secondary | ICD-10-CM | POA: Diagnosis not present

## 2021-11-17 DIAGNOSIS — H40021 Open angle with borderline findings, high risk, right eye: Secondary | ICD-10-CM | POA: Diagnosis not present

## 2021-11-17 DIAGNOSIS — H401121 Primary open-angle glaucoma, left eye, mild stage: Secondary | ICD-10-CM | POA: Diagnosis not present

## 2021-12-13 DIAGNOSIS — F3341 Major depressive disorder, recurrent, in partial remission: Secondary | ICD-10-CM | POA: Diagnosis not present

## 2022-01-03 DIAGNOSIS — F332 Major depressive disorder, recurrent severe without psychotic features: Secondary | ICD-10-CM | POA: Diagnosis not present

## 2022-03-19 DIAGNOSIS — L989 Disorder of the skin and subcutaneous tissue, unspecified: Secondary | ICD-10-CM | POA: Diagnosis not present

## 2022-03-19 DIAGNOSIS — D485 Neoplasm of uncertain behavior of skin: Secondary | ICD-10-CM | POA: Diagnosis not present

## 2022-03-29 DIAGNOSIS — N529 Male erectile dysfunction, unspecified: Secondary | ICD-10-CM | POA: Diagnosis not present

## 2022-05-23 DIAGNOSIS — H40021 Open angle with borderline findings, high risk, right eye: Secondary | ICD-10-CM | POA: Diagnosis not present

## 2022-05-23 DIAGNOSIS — H401121 Primary open-angle glaucoma, left eye, mild stage: Secondary | ICD-10-CM | POA: Diagnosis not present

## 2022-06-28 DIAGNOSIS — Z23 Encounter for immunization: Secondary | ICD-10-CM | POA: Diagnosis not present

## 2022-07-31 DIAGNOSIS — L570 Actinic keratosis: Secondary | ICD-10-CM | POA: Diagnosis not present

## 2022-07-31 DIAGNOSIS — D225 Melanocytic nevi of trunk: Secondary | ICD-10-CM | POA: Diagnosis not present

## 2022-07-31 DIAGNOSIS — L814 Other melanin hyperpigmentation: Secondary | ICD-10-CM | POA: Diagnosis not present

## 2022-07-31 DIAGNOSIS — Z86018 Personal history of other benign neoplasm: Secondary | ICD-10-CM | POA: Diagnosis not present

## 2022-07-31 DIAGNOSIS — L821 Other seborrheic keratosis: Secondary | ICD-10-CM | POA: Diagnosis not present

## 2022-09-04 DIAGNOSIS — H6090 Unspecified otitis externa, unspecified ear: Secondary | ICD-10-CM | POA: Diagnosis not present

## 2022-09-04 DIAGNOSIS — H6121 Impacted cerumen, right ear: Secondary | ICD-10-CM | POA: Diagnosis not present

## 2022-09-26 DIAGNOSIS — M545 Low back pain, unspecified: Secondary | ICD-10-CM | POA: Diagnosis not present

## 2022-09-26 DIAGNOSIS — M79661 Pain in right lower leg: Secondary | ICD-10-CM | POA: Diagnosis not present

## 2022-11-01 DIAGNOSIS — E7800 Pure hypercholesterolemia, unspecified: Secondary | ICD-10-CM | POA: Diagnosis not present

## 2022-11-01 DIAGNOSIS — N401 Enlarged prostate with lower urinary tract symptoms: Secondary | ICD-10-CM | POA: Diagnosis not present

## 2022-11-01 DIAGNOSIS — Z0000 Encounter for general adult medical examination without abnormal findings: Secondary | ICD-10-CM | POA: Diagnosis not present

## 2022-11-01 DIAGNOSIS — E782 Mixed hyperlipidemia: Secondary | ICD-10-CM | POA: Diagnosis not present

## 2022-11-06 DIAGNOSIS — Z0000 Encounter for general adult medical examination without abnormal findings: Secondary | ICD-10-CM | POA: Diagnosis not present

## 2022-11-14 DIAGNOSIS — M79604 Pain in right leg: Secondary | ICD-10-CM | POA: Diagnosis not present

## 2022-11-14 DIAGNOSIS — M6281 Muscle weakness (generalized): Secondary | ICD-10-CM | POA: Diagnosis not present

## 2022-11-27 DIAGNOSIS — H524 Presbyopia: Secondary | ICD-10-CM | POA: Diagnosis not present

## 2022-11-27 DIAGNOSIS — H401231 Low-tension glaucoma, bilateral, mild stage: Secondary | ICD-10-CM | POA: Diagnosis not present

## 2022-11-27 DIAGNOSIS — H2513 Age-related nuclear cataract, bilateral: Secondary | ICD-10-CM | POA: Diagnosis not present

## 2022-12-12 DIAGNOSIS — M79604 Pain in right leg: Secondary | ICD-10-CM | POA: Diagnosis not present

## 2022-12-12 DIAGNOSIS — M6281 Muscle weakness (generalized): Secondary | ICD-10-CM | POA: Diagnosis not present

## 2023-03-13 DIAGNOSIS — Z86010 Personal history of colonic polyps: Secondary | ICD-10-CM | POA: Diagnosis not present

## 2023-03-13 DIAGNOSIS — D122 Benign neoplasm of ascending colon: Secondary | ICD-10-CM | POA: Diagnosis not present

## 2023-03-13 DIAGNOSIS — K648 Other hemorrhoids: Secondary | ICD-10-CM | POA: Diagnosis not present

## 2023-03-13 DIAGNOSIS — D124 Benign neoplasm of descending colon: Secondary | ICD-10-CM | POA: Diagnosis not present

## 2023-04-24 DIAGNOSIS — N5201 Erectile dysfunction due to arterial insufficiency: Secondary | ICD-10-CM | POA: Diagnosis not present

## 2023-04-24 DIAGNOSIS — N401 Enlarged prostate with lower urinary tract symptoms: Secondary | ICD-10-CM | POA: Diagnosis not present

## 2023-04-24 DIAGNOSIS — R3915 Urgency of urination: Secondary | ICD-10-CM | POA: Diagnosis not present

## 2023-04-24 DIAGNOSIS — R351 Nocturia: Secondary | ICD-10-CM | POA: Diagnosis not present

## 2023-06-04 DIAGNOSIS — R3915 Urgency of urination: Secondary | ICD-10-CM | POA: Diagnosis not present

## 2023-06-04 DIAGNOSIS — R351 Nocturia: Secondary | ICD-10-CM | POA: Diagnosis not present

## 2023-06-05 DIAGNOSIS — H401231 Low-tension glaucoma, bilateral, mild stage: Secondary | ICD-10-CM | POA: Diagnosis not present

## 2023-06-06 DIAGNOSIS — R3915 Urgency of urination: Secondary | ICD-10-CM | POA: Diagnosis not present

## 2023-06-06 DIAGNOSIS — R351 Nocturia: Secondary | ICD-10-CM | POA: Diagnosis not present

## 2023-08-01 DIAGNOSIS — D692 Other nonthrombocytopenic purpura: Secondary | ICD-10-CM | POA: Diagnosis not present

## 2023-08-01 DIAGNOSIS — L814 Other melanin hyperpigmentation: Secondary | ICD-10-CM | POA: Diagnosis not present

## 2023-08-01 DIAGNOSIS — L821 Other seborrheic keratosis: Secondary | ICD-10-CM | POA: Diagnosis not present

## 2023-08-01 DIAGNOSIS — D225 Melanocytic nevi of trunk: Secondary | ICD-10-CM | POA: Diagnosis not present

## 2023-08-01 DIAGNOSIS — D485 Neoplasm of uncertain behavior of skin: Secondary | ICD-10-CM | POA: Diagnosis not present

## 2023-08-01 DIAGNOSIS — L309 Dermatitis, unspecified: Secondary | ICD-10-CM | POA: Diagnosis not present

## 2023-08-22 DIAGNOSIS — M76811 Anterior tibial syndrome, right leg: Secondary | ICD-10-CM | POA: Diagnosis not present

## 2023-10-01 DIAGNOSIS — M6281 Muscle weakness (generalized): Secondary | ICD-10-CM | POA: Diagnosis not present

## 2023-10-01 DIAGNOSIS — R3915 Urgency of urination: Secondary | ICD-10-CM | POA: Diagnosis not present

## 2023-10-01 DIAGNOSIS — M629 Disorder of muscle, unspecified: Secondary | ICD-10-CM | POA: Diagnosis not present

## 2023-10-01 DIAGNOSIS — R351 Nocturia: Secondary | ICD-10-CM | POA: Diagnosis not present

## 2023-11-11 DIAGNOSIS — Z125 Encounter for screening for malignant neoplasm of prostate: Secondary | ICD-10-CM | POA: Diagnosis not present

## 2023-11-11 DIAGNOSIS — Z0000 Encounter for general adult medical examination without abnormal findings: Secondary | ICD-10-CM | POA: Diagnosis not present

## 2023-11-11 DIAGNOSIS — E782 Mixed hyperlipidemia: Secondary | ICD-10-CM | POA: Diagnosis not present

## 2023-11-11 DIAGNOSIS — E7800 Pure hypercholesterolemia, unspecified: Secondary | ICD-10-CM | POA: Diagnosis not present

## 2023-11-12 DIAGNOSIS — Z0000 Encounter for general adult medical examination without abnormal findings: Secondary | ICD-10-CM | POA: Diagnosis not present

## 2023-11-12 DIAGNOSIS — N529 Male erectile dysfunction, unspecified: Secondary | ICD-10-CM | POA: Diagnosis not present

## 2023-11-12 DIAGNOSIS — E7800 Pure hypercholesterolemia, unspecified: Secondary | ICD-10-CM | POA: Diagnosis not present

## 2023-11-12 DIAGNOSIS — B351 Tinea unguium: Secondary | ICD-10-CM | POA: Diagnosis not present

## 2023-11-12 DIAGNOSIS — H9193 Unspecified hearing loss, bilateral: Secondary | ICD-10-CM | POA: Diagnosis not present

## 2023-11-20 DIAGNOSIS — M62838 Other muscle spasm: Secondary | ICD-10-CM | POA: Diagnosis not present

## 2023-11-20 DIAGNOSIS — R3915 Urgency of urination: Secondary | ICD-10-CM | POA: Diagnosis not present

## 2023-11-20 DIAGNOSIS — M6281 Muscle weakness (generalized): Secondary | ICD-10-CM | POA: Diagnosis not present

## 2023-11-20 DIAGNOSIS — R351 Nocturia: Secondary | ICD-10-CM | POA: Diagnosis not present

## 2023-11-26 DIAGNOSIS — D485 Neoplasm of uncertain behavior of skin: Secondary | ICD-10-CM | POA: Diagnosis not present

## 2023-11-26 DIAGNOSIS — L821 Other seborrheic keratosis: Secondary | ICD-10-CM | POA: Diagnosis not present

## 2023-11-26 DIAGNOSIS — D0472 Carcinoma in situ of skin of left lower limb, including hip: Secondary | ICD-10-CM | POA: Diagnosis not present

## 2023-12-02 DIAGNOSIS — M545 Low back pain, unspecified: Secondary | ICD-10-CM | POA: Diagnosis not present

## 2023-12-17 DIAGNOSIS — M545 Low back pain, unspecified: Secondary | ICD-10-CM | POA: Diagnosis not present

## 2024-01-08 DIAGNOSIS — M545 Low back pain, unspecified: Secondary | ICD-10-CM | POA: Diagnosis not present

## 2024-01-16 DIAGNOSIS — M5416 Radiculopathy, lumbar region: Secondary | ICD-10-CM | POA: Diagnosis not present

## 2024-01-26 HISTORY — PX: BRAIN AVM REPAIR: SHX202

## 2024-01-29 DIAGNOSIS — M5416 Radiculopathy, lumbar region: Secondary | ICD-10-CM | POA: Diagnosis not present

## 2024-02-04 ENCOUNTER — Ambulatory Visit: Admitting: Audiology

## 2024-02-16 DIAGNOSIS — R569 Unspecified convulsions: Secondary | ICD-10-CM | POA: Diagnosis not present

## 2024-02-16 DIAGNOSIS — R29721 NIHSS score 21: Secondary | ICD-10-CM | POA: Diagnosis not present

## 2024-02-16 DIAGNOSIS — I671 Cerebral aneurysm, nonruptured: Secondary | ICD-10-CM | POA: Diagnosis not present

## 2024-02-16 DIAGNOSIS — I619 Nontraumatic intracerebral hemorrhage, unspecified: Secondary | ICD-10-CM | POA: Diagnosis not present

## 2024-02-16 DIAGNOSIS — G40909 Epilepsy, unspecified, not intractable, without status epilepticus: Secondary | ICD-10-CM | POA: Diagnosis not present

## 2024-02-16 DIAGNOSIS — S199XXA Unspecified injury of neck, initial encounter: Secondary | ICD-10-CM | POA: Diagnosis not present

## 2024-02-16 DIAGNOSIS — E785 Hyperlipidemia, unspecified: Secondary | ICD-10-CM | POA: Diagnosis not present

## 2024-02-16 DIAGNOSIS — E871 Hypo-osmolality and hyponatremia: Secondary | ICD-10-CM | POA: Diagnosis not present

## 2024-02-16 DIAGNOSIS — H9190 Unspecified hearing loss, unspecified ear: Secondary | ICD-10-CM | POA: Diagnosis not present

## 2024-02-16 DIAGNOSIS — E876 Hypokalemia: Secondary | ICD-10-CM | POA: Diagnosis not present

## 2024-02-16 DIAGNOSIS — G8101 Flaccid hemiplegia affecting right dominant side: Secondary | ICD-10-CM | POA: Diagnosis not present

## 2024-02-16 DIAGNOSIS — G911 Obstructive hydrocephalus: Secondary | ICD-10-CM | POA: Diagnosis not present

## 2024-02-16 DIAGNOSIS — R001 Bradycardia, unspecified: Secondary | ICD-10-CM | POA: Diagnosis not present

## 2024-02-16 DIAGNOSIS — I10 Essential (primary) hypertension: Secondary | ICD-10-CM | POA: Diagnosis not present

## 2024-02-16 DIAGNOSIS — I615 Nontraumatic intracerebral hemorrhage, intraventricular: Secondary | ICD-10-CM | POA: Diagnosis not present

## 2024-02-16 DIAGNOSIS — G4089 Other seizures: Secondary | ICD-10-CM | POA: Diagnosis not present

## 2024-02-16 DIAGNOSIS — I609 Nontraumatic subarachnoid hemorrhage, unspecified: Secondary | ICD-10-CM | POA: Diagnosis not present

## 2024-02-16 DIAGNOSIS — I611 Nontraumatic intracerebral hemorrhage in hemisphere, cortical: Secondary | ICD-10-CM | POA: Diagnosis not present

## 2024-02-16 DIAGNOSIS — R404 Transient alteration of awareness: Secondary | ICD-10-CM | POA: Diagnosis not present

## 2024-02-16 DIAGNOSIS — R4182 Altered mental status, unspecified: Secondary | ICD-10-CM | POA: Diagnosis not present

## 2024-02-16 DIAGNOSIS — I6200 Nontraumatic subdural hemorrhage, unspecified: Secondary | ICD-10-CM | POA: Diagnosis not present

## 2024-02-16 DIAGNOSIS — Z4659 Encounter for fitting and adjustment of other gastrointestinal appliance and device: Secondary | ICD-10-CM | POA: Diagnosis not present

## 2024-02-16 DIAGNOSIS — R55 Syncope and collapse: Secondary | ICD-10-CM | POA: Diagnosis not present

## 2024-02-16 DIAGNOSIS — F05 Delirium due to known physiological condition: Secondary | ICD-10-CM | POA: Diagnosis not present

## 2024-02-16 DIAGNOSIS — R1110 Vomiting, unspecified: Secondary | ICD-10-CM | POA: Diagnosis not present

## 2024-02-16 DIAGNOSIS — G936 Cerebral edema: Secondary | ICD-10-CM | POA: Diagnosis not present

## 2024-02-16 DIAGNOSIS — Z4682 Encounter for fitting and adjustment of non-vascular catheter: Secondary | ICD-10-CM | POA: Diagnosis not present

## 2024-02-16 DIAGNOSIS — H53461 Homonymous bilateral field defects, right side: Secondary | ICD-10-CM | POA: Diagnosis not present

## 2024-02-17 DIAGNOSIS — I611 Nontraumatic intracerebral hemorrhage in hemisphere, cortical: Secondary | ICD-10-CM | POA: Diagnosis not present

## 2024-02-17 DIAGNOSIS — I671 Cerebral aneurysm, nonruptured: Secondary | ICD-10-CM | POA: Diagnosis not present

## 2024-02-18 DIAGNOSIS — I671 Cerebral aneurysm, nonruptured: Secondary | ICD-10-CM | POA: Diagnosis not present

## 2024-02-19 DIAGNOSIS — I671 Cerebral aneurysm, nonruptured: Secondary | ICD-10-CM | POA: Diagnosis not present

## 2024-02-20 DIAGNOSIS — I671 Cerebral aneurysm, nonruptured: Secondary | ICD-10-CM | POA: Diagnosis not present

## 2024-02-21 DIAGNOSIS — I671 Cerebral aneurysm, nonruptured: Secondary | ICD-10-CM | POA: Diagnosis not present

## 2024-02-21 NOTE — Progress Notes (Signed)
 Acute Physical Therapy Treatment  Visit Count: PT Visit Count: 2  Precautions: Other Therapy Precautions: Fall risk Comment: EVD, SBP goal 90-160; R HH  Medical Diagnosis/Course: PT Diagnosis/Course Pertinent Medical Course: Per NS note 6/24/ 37, 63 y.o. 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 Pt had some delirium night of 6/23 and displays R HH  Assessment and Plan  Assessment: Patient is now hospital day #4 with decreased participation 2/2 headache and fatigue today.  Today's PT session focused on family and patient education with education provided re: ambulating 3x/day, up in chair for meals, visual scanning activities to improve R inattention, home safety modifications, d/c recs, benefits of OOB mobility, pacing/energy conservation techniques with patient and family verbalizing good understanding of all education provided.   Patient scored 18/24 on the [facility] Mobility Assessment indicating 46.58% impairment of functional mobility.  Continuation of skilled acute PT services is indicated in order to increase independence with all functional mobility.  Problem list: Impairments/Limitations: Activity Tolerance Deficits, Ambulation Deficits, Balance Deficits, Bed mobility deficits, Mobility deficits, Safety Awareness deficits, Transfer deficits, Visual/Perceptual Deficits   PT Recommendation: PT Recommendation: Can tolerate 3 hrs of therapy, Rehabilitation Facility (may progress to home) PT Equipment Recommended:  (will continue to assess)  PT recommends: assistance for medication management, balance support with mobility, assistance with bed mobility, assistance with transfers, assistance with gait, assistance with IADLs (home management, meal preparations/cooking, pet care, shopping), assistance with transportation, assistance with management of emergency situations, identification of safety  hazards within environment, and assistance with higher level cognitive tasks (medication management, money management, appointment management)  Home Living Environment: Type of Home House  Home Layout One level  Exterior Stairs - number no steps at patio entrance  [employer] Stairs - Location manager Stairs - number    Interior Stairs - Administrator, sports / Tub Dietitian Currently Using none  Additional Comments     Prior Level of Function: Level of Independence Independent  Lives With [employer]) able to assist at d/c spouse 24/7 S/A  Patient Responsibilities Driving, Employed, Water quality scientist, Dentist, Manage Medications, Meal Preparation, Personal ADLs, Shopping, Social Participation, Psychologist, 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)  Requires Assist With     PT Goals:  Patient and Family Goals: return to [employer] Treatment Plan/Goals Established with Patient/Caregiver: Yes  Encounter Problems     Encounter Problems (Active)     Physical Therapy     Patient will go supine to/from sit Independently     Start:  02/18/24    Expected End:  03/10/24             Patient will perform sit-to-stand Independently using No assistive device     Start:  02/18/24    Expected End:  03/10/24             Patient will ambulate 150 feet Independently using No assistive device     Start:  02/18/24    Expected End:  03/10/24         Patient/caregiver will be able to verbalize 3 fall prevention strategies to decrease risk of falls during functional mobility     Start:  02/18/24    Expected End:  03/10/24         Patient  will improve [employer] AM-PAC 6 Clicks Basic Mobility Inpatient Short Form score from 12/24 to 24/24 to indicate improved functional mobility.      Start:  02/18/24    Expected End:  03/10/24         Pt will score at least 20/24 on DGI to show low fall risk      Start:  02/18/24    Expected End:  03/10/24             Rehab Potential:  Rehab Potential: Good Complexity/Co-morbidities that Impact POC: Cognitive/Memory deficits, Neurologic Diagnosis, Reduced activity level, Severity of condition, Time since onset/Exacerbation Impairments/Limitations: Activity Tolerance Deficits, Ambulation Deficits, Balance Deficits, Bed mobility deficits, Mobility deficits, Safety Awareness deficits, Transfer deficits, Visual/Perceptual Deficits  Plan:  Patient's Prior Level of Function Independent Independent / Mod I (AMPAC 22-24)     (3pts)   Patient's Current Level of Function (18) Needs >= Mod A (AMPAC <12)   (1pt)   Likelihood for functional decline without therapy .  Highly Likely   (3pts)  Likelihood  to make significant functional gains with therapy. Highly Likely   (3pts)  Acute therapy likely to change discharge disposition. Fairly Likely    (2pts)   Recommended Frequency: 3-4 times a week (8-12 points total)  PT Frequency: 5x week  Recommend Duration:  For 3 weeks  Recommend Interventions: Patient/Caregiver education, Therapeutic activity, Therapeutic exercise, Gait/mobility training, Neuromuscular re-education, Self care home management   SUBJECTIVE  Interdisciplinary Team Communication Communication: Patient, Family/Caregiver, Nursing Communication Details: RN gave permission to see patient.  Patient and family educated on d/c recs, benefits of OOB mobility, pacing/energy conservation techniques, up in chair for meals, visual scanning techniques, home safety modifications, visual scanning activities to improve R attention.   General Family/Caregiver Present: wife, other family member  Subjective: I can't walk right now.  Can you come back later?  Pain: 5/10 headache and neck pain, sore/sharp, ice pack provided for patient to decrease neck pain  OBJECTIVE  Cognition Orientation Level: Not oriented Not Oriented to:  Time Affect/Behavior: Lethargic Safety Awareness: Impaired  Vitals:  HR 56 bpm, SPO2 100%, RR 14, BP 132/70  Balance: Balance Additional Comments: Patient deferred OOB mobility 2/2 headache and fatigue today  Skin Integrity and Edema:  Intact except surgical site, no edema  Interventions: Therapeutic Activity:  Bed Mobility Rolling: Contact Guard Assistance, Close supervision, Rolling - Left, Rolling - Right, Extra time Bed Mobility Additional Comments: EOB deferred 2/2 headache and fatigue Extensive education: See communication and assessment section of note.  Outcomes AM-PAC - Basic Mobility:    Flowsheet Row Most Recent Value  AM-PAC 6-Clicks - Basic Mobility  Turning from you back to your side while in a flat bed without using bed rails? None  Moving from lying on your back to sitting on the side of a flat bed without using bed rails? A little  Moving to and from a bed to a chair (including a wheelchair)? A little  Standing up from a chair using your arms (e.g, wheelchair, or bedside chair)? A little  To walk in a hospital room? A little  Climbing 3-5 steps with a railing? A lot  AM-PAC Total Score 18   Condition After Therapy:  Back to bed, Nursing notified of condition, All needs within reach, Family present in room, Lines intact, Fall interventions in place  Treatment Times Time In: 1236 Time Out: 1248 Time in Timed codes: 12 Total Treatment Time: 12     Treatment/Procedures  Time Entry Therapeutic Activity minutes: 12   Charges           02/21/2024   Code Description Service Provider Modifiers Quantity  [id number] Hc Pt Therapeut Actvity Direct Pt Contact Each 15 Min Delon Helling Bud, [place] GP 1        Time of Service Note Type Status  None

## 2024-02-21 NOTE — Progress Notes (Signed)
 ------------------------------------------------------------------------------- Attestation signed by Atha Macario Ruth, DO at 02/21/2024  3:25 PM Attestation:  Billing per APP. Plan discussed in detail. Please see separate attending note.  Atha Macario Ruth, DO 02/21/2024 -------------------------------------------------------------------------------  NeuroCritical Care Progress Note  LOS: 5 days   ICU Admission Date: 02/16/24        Primary Diagnosis leading to ICU admission: Left Occipital ICH; Cerebrovascular dural AV fistula (CMD)  HPI: [name] is a 63 year old male with PMHx of remote seizures (~40 years ago, no longer on AEDs) who presented to OSH on 6/22 after he collapsed and became unresponsive while playing tennis. EMS was called and patient was taken to local ED. There he had witnessed seizure and noted to be flaccid in RHB. He was given 2g Keppra and intubated for airway protection. CTH revealed large left occipital ICH with IVH. He was transferred to [facility] for neurosurgical evaluation.  On arrival to our ED, CTA obtained which was concerning for a cerebral dural AV fistula as etiology for hemorrhage. EVD was placed by NSG and he was taken emergently to IR for embolization of AV fistula. Post-procedure, he was admitted to Neuro ICU.  The following day on 6/23, patient was successfully extubated to room air. Neuro exam improved; patient began moving all extremities to command, with RHB remaining slightly weaker/delayed compared to LHB. Continued to have intermittent agitation post extubation requiring precedex drip, now improved, weaned off on 6/24. Evaluated by speech and cleared for a regular diet. He has been ambulating well with PT. Patient remains in ICU for EVD weaning.  6/27: Overnight, patient spiked fever to 101.7, cultures were sent. NSG planning for EVD clamp trial today.  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,POA,active -EVD placed 6/22 -Q4h neuro checks  -Continue Keppra 750mg  BID  -EVD @ ; monitor ICP/output > Clamp trial today per NSG  -PRN Robaxin for neck stiffness  -PRN Norco for headache  -Lidocaine patch for neck pain   -PT/OT   Pulm:    -Stable on RA  -Encourage IS   Cardio:    -SBP goal < 160  -PRN hydralazine, labetalol   Gastro:    -Regular diet  -Continue ICU Bowel Regimen  -Continue GI ppx with Pepcid    FEN/Renal:   Hyponatremia,concern for CSW,nPOA,active -Add Florinef 0.2mg  BID due to increased UOP  -Give 1L fluid bolus today  -Continue salt tabs 1g TID  -Monitor I/Os    ID:    -Monitoring for s/s infection   Endo: A1c 5.3  -Daily glucose monitoring with labs  Hem/Onc:    -SQH for DVT ppx  -Daily CBC   Lines: PIV x2 EVD  -Continue current lines  Communication:   -Patient and family updated at bedside    Discharge planning:   -Remain in ICU   ROS: Pertinent positive and negative findings are listed as part of the history of present illness, all other systems were reviewed and are negative.   Objective: Vitals: BP: 153/81, Temp: 99 F (37.2 C), Temp Source: Oral, Heart Rate: 52, Resp: 13, SpO2: 100 %;     Allergies and current medications given over last 24 hours reviewed.  Physical Exam: GENERAL APPEARANCE: Lying in bed, NAD HEENT: EVD in place NECK: Supple CARDIAC AND VASCULAR: Sinus bradycardia; No murmur; extremities warm/dry RESPIRATORY: WOB unlabored, breath sounds clear, on room  air ABDOMINAL & GASTROINTESTINAL: ABD soft, non-distended, BS present GENITOURINARY: No Foley NEUROLOGIC: Alert, Oriented x3, Intermittent confusion. Follows commands. R visual field cut. -Pupils: 59mm/brisk -Facial strength smile/grimace: Symmetric -Speech: Clear -Motor Strength: MAE 5/5    This plan of care was reviewed  with and supervised by ICU Attending Atha Ruth.  Electronically signed by:  Harlene Anette Kipper, NP, 02/21/2024 2:59 PM

## 2024-02-22 DIAGNOSIS — I671 Cerebral aneurysm, nonruptured: Secondary | ICD-10-CM | POA: Diagnosis not present

## 2024-02-23 DIAGNOSIS — I671 Cerebral aneurysm, nonruptured: Secondary | ICD-10-CM | POA: Diagnosis not present

## 2024-02-24 DIAGNOSIS — I671 Cerebral aneurysm, nonruptured: Secondary | ICD-10-CM | POA: Diagnosis not present

## 2024-02-24 DIAGNOSIS — I615 Nontraumatic intracerebral hemorrhage, intraventricular: Secondary | ICD-10-CM | POA: Diagnosis not present

## 2024-02-24 DIAGNOSIS — I611 Nontraumatic intracerebral hemorrhage in hemisphere, cortical: Secondary | ICD-10-CM | POA: Diagnosis not present

## 2024-02-24 DIAGNOSIS — G911 Obstructive hydrocephalus: Secondary | ICD-10-CM | POA: Diagnosis not present

## 2024-02-24 NOTE — Progress Notes (Signed)
   02/24/24 1524  PT Treatment Type  Visit Type Treatment  Reason Eval/Treat Not Completed  Reason Eval/Treat Not Completed Patient Unavailable   MD at bedside at time of attempt. Will continue efforts as schedule allows.

## 2024-02-25 DIAGNOSIS — I671 Cerebral aneurysm, nonruptured: Secondary | ICD-10-CM | POA: Diagnosis not present

## 2024-02-25 NOTE — Progress Notes (Signed)
 Acute Physical Therapy Treatment  Visit Count: PT Visit Count: 3  Precautions: Other Therapy Precautions: Fall risk Comment: EVD, SBP goal 90-160; R HH  Medical Diagnosis/Course: PT Diagnosis/Course PT Medical Dx: Cerebrovascular dural AV fistula Pertinent Medical Course: Per NP notes: [name] is a 63 year old male with PMHx of remote seizures (~40 years ago, no longer on AEDs) who presented to OSH on 6/22 after he collapsed and became unresponsive while playing tennis. EMS was called and patient was taken to local ED. There he had witnessed seizure and noted to be flaccid in RHB. He was given 2g Keppra and intubated for airway protection. CTH revealed large left occipital ICH with IVH. He was transferred to [facility] for neurosurgical evaluation.     On arrival to our ED, CTA obtained which was concerning for a cerebral dural AV fistula as etiology for hemorrhage. EVD was placed by NSG and he was taken emergently to IR for embolization of AV fistula. Post-procedure, he was admitted to Neuro ICU.     The following day on 6/23, patient was successfully extubated to room air. Neuro exam improved; patient began moving all extremities to command, with RHB remaining slightly weaker/delayed compared to LHB. Continued to have intermittent agitation post extubation requiring precedex drip, now improved, weaned off on 6/24. Evaluated by speech and cleared for a regular diet. He has been ambulating well with PT. Patient remains in ICU for EVD weaning. Unfortunately, patient failed EVD clamp trial 02/21/24 and 02/24/2024 due to elevated ICPs. Awaiting further EVD plans for NSG.    Assessment and Plan  Assessment: Therapeutic interventions focused on progression of mobility and ambulation. Patient required up to [facility] for ambulation in hallway this session. Able to complete toileting and hand hygiene tasks in standing with CGA required for dynamic standing tasks.  He is demonstrating improving   participation, activity tolerance, and implementation of safe movement strategies after intervention. Patient is most limited by  pain, decreased functional endurance, and imbalance, contributing to a decline in mobility compared to prior level of function and interfering with progress. Patient was alert and participatory throughout the session. Vitals were stable and patient asymptomatic.   Patient is functioning below prior level of function, scoring 19 /24 on the [facility] Mobility Assessment, indicating mild impairment of functional mobility. Based on clinical findings, further skilled therapy services are reasonable and necessary to address continued impairment and performance deficits with the goal to maximize functional independence and decrease risk for falls. Anticipate patient will progress to d/c home after additional acute PT intervention.  Recommend OPPT upon d/c to improve strength, balance, endurance, progress towards greater independence with mobility, and reduce fall risk.  Problem list: Impairments/Limitations: Activity Tolerance Deficits, Ambulation Deficits, Balance Deficits, Bed mobility deficits, Mobility deficits, Safety Awareness deficits, Transfer deficits, Visual/Perceptual Deficits   PT Recommendation: PT Recommendation: Home with intermittent supervision, Home with intermittent assistance, Outpatient PT PT Equipment Recommended: None (Will continue to assess.)  PT recommends: balance support with mobility, assistance lower body ADLs, assistance with IADLs (home management, meal preparations/cooking, pet care, shopping), assistance with management of emergency situations, and identification of safety hazards within environment  Home Living Environment: Type of Home House  Home Layout One level  Exterior Stairs - number no steps at [employer] entrance  [employer] Stairs - [employer] Stairs - number    Interior Stairs - Administrator, sports / [employer] manager  Home Equipment    Equipment Currently Using none  Additional Comments     Prior Level of Function: Level of Independence Independent  Lives With Spouse  Person(s) able to assist at d/c spouse 24/7 S/A  Patient Responsibilities Driving, Employed, Home management, Manage Finances, Manage Medications, Meal Preparation, Personal ADLs, Shopping, Social Participation, 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)  Requires Assist With     PT Goals:  Patient and Family Goals: return to [employer] Treatment Plan/Goals Established with Patient/Caregiver: Yes  Encounter Problems     Encounter Problems (Active)     Physical Therapy     Patient will go supine to/from sit Independently     Start:  02/18/24    Expected End:  02/28/24             Patient will perform sit-to-stand Independently using No assistive device     Start:  02/18/24    Expected End:  02/28/24             Patient will ambulate 150 feet Independently using No assistive device     Start:  02/18/24    Expected End:  02/28/24         Patient/caregiver will be able to verbalize 3 fall prevention strategies to decrease risk of falls during functional mobility     Start:  02/18/24    Expected End:  02/28/24         Patient will improve [employer] AM-PAC 6 Clicks Basic Mobility Inpatient Short Form score from 12/24 to 24/24 to indicate improved functional mobility.      Start:  02/18/24    Expected End:  02/28/24         Pt will score at least 20/24 on DGI to show low fall risk     Start:  02/18/24    Expected End:  02/28/24             Rehab Potential:  Rehab Potential: Good Complexity/Co-morbidities that Impact POC: Cognitive/Memory deficits, Neurologic Diagnosis, Reduced activity level, Severity of condition, Time since onset/Exacerbation Impairments/Limitations: Activity Tolerance Deficits, Ambulation Deficits, Balance Deficits, Bed mobility deficits, Mobility  deficits, Safety Awareness deficits, Transfer deficits, Visual/Perceptual Deficits  Plan:   Recommended Frequency: 5-6 times a week (>12 points total)  PT Frequency: 5x week  Recommend Duration:  For 3 weeks  Recommend Interventions: Patient/Caregiver education, Therapeutic activity, Therapeutic exercise, Gait/mobility training, Neuromuscular re-education, Self care home management   SUBJECTIVE  Interdisciplinary Team Communication Communication: Patient, Family/Caregiver, Nursing Communication Details: RN approved session, clamped EVD prior to session   General Family/Caregiver Present: wife  Subjective: I feel much better today  Pain: Pain Assessment Pain Assessment: 0-10 Pain Score  : 2 Pain Type: Acute pain Pain Location: Head Pain Descriptors: Headache Pain Frequency: Intermittent Clinical Progression: Gradually improving Pain Intervention(s): Ambulation/Increased activity Response to Interventions: Therapy to tolerance   OBJECTIVE  Cognition Orientation Level: Not oriented Not Oriented to: Time  Vitals:  Vitals monitored via ICU monitor throughout session and maintained within parameters.   Balance: Sitting - Static: Distant supervision Sitting - Dynamic: Close supervision Standing - Static: Close supervision Standing - Dynamic: Contact Guard Assistance Balance Additional Comments: Minimal unsteadiness noted with standing balance, able to stand for urinating/hand hygiene.  Able to don briefs in standing with CGA, hand hold on grab bars for assist.  Skin Integrity and Edema:  Skin Integrity & Edema Skin: Intact except surgical incision(s) Edema: No edema   Interventions: Therapeutic Activity:  Bed Mobility Bed  Mobility Additional Comments: Pt encountered in chair Transfers Assistive Device: None Sit to Stand: [employer], Verbal Cues, Extra time Stand to Sit: [employer], Verbal Cues, Extra time Transfers  Additional  Comments: Cuing provided for UE assist with transfers, safety with EVD and lines, requires increased effort/UE assist to complete STS from chair. See balance section above for additional details  Gait Training: Gait Deviation: Base of Support-wide, Shuffle, Decreased Gait Speed, Decreased stride length- Right, Decreased stride length -Left Assistive Device: None Gait Distance (ft): 250 ft Gait Assistance: Contact Guard Assistance, Extra time, Verbal Cues Gait Additional Comments: minimal unsteadiness noted with gait trial. Requires frequent standing rest breaks for intermittant L sided head pain, requires CGA for safety.  Education provided regarding pacing and activity progression. PT Mobility Additional Comments: VS and symptoms monitored throughout session, maintain ICP < 20., no increase in HA noted.       Outcomes AM-PAC - Basic Mobility:    Flowsheet Row Most Recent Value  AM-PAC 6-Clicks - Basic Mobility  Turning from you back to your side while in a flat bed without using bed rails? None  Moving from lying on your back to sitting on the side of a flat bed without using bed rails? None  Moving to and from a bed to a chair (including a wheelchair)? A little  Standing up from a chair using your arms (e.g, wheelchair, or bedside chair)? A little  To walk in a hospital room? A little  Climbing 3-5 steps with a railing? A lot  AM-PAC Total Score 19    Condition After Therapy:  Up in chair, Nursing notified of condition, All needs within reach, Lines intact, Family present in room, Fall interventions in place  Treatment Times Time In: 1110 Time Out: 1133 Time in Timed codes: 04/13/24 Total Treatment Time: 23     Treatment/Procedures Time Entry Gait/Mobility minutes: 10 Therapeutic Activity minutes: 13   Charges           02/25/2024   Code Description Service Provider Modifiers Quantity  [id number] Hc Pt Gait Training Therapy Mischa Jemionek, PT GP 1  [id number] Hc Pt Therapeut  [employer] Direct Pt Contact Each 15 Min Mischa Jemionek, PT GP 1        Time of Service Note Type Status  None

## 2024-02-25 NOTE — Group Note (Signed)
 Inpatient Care Coordination Team Conference Note  02/25/2024   Time:9:05 AM   CSN: [id number]  DOB: 1961/02/18   Room/Bed: B506/A LOS: 9 Payor Info: Payor: BCBS / Plan: BCBS FEDERAL EMPLOYEE / Product Type: PPO /    Admitting Diagnosis: Cerebrovascular dural AV fistula (CMD) [I67.1]  Admit Date/Time: 02/16/2024  3:08 PM Admission Comments: No comment available   Primary Diagnosis: Cerebrovascular dural AV fistula (CMD) Principal Problem: Cerebrovascular dural AV fistula (CMD)  Predictive Model Details       14% (Medium) Factor Value   Risk of Unplanned Readmission Model Number of active inpatient medication orders 31    ECG/EKG order present in last 6 months    Current length of stay 8.563 days    Restraint order present in last 6 months    Imaging order present in last 6 months    Phosphorous result present    Number of ED visits in last six months 1    Age 63    Active anticoagulant inpatient medication order present    Charlson Comorbidity Index 2     Team Members Present: Case Manager, Advanced Practice Provider, Nurse, Nutritionist, Pharmacist  Expected Discharge Date: Feb 28, 2024  Sotero DELENA Pilgrim, RN

## 2024-02-25 NOTE — Progress Notes (Signed)
 ------------------------------------------------------------------------------- Attestation signed by Rayfield Claris Dollar, MD at 02/25/2024 12:56 PM (Updated) Critical Care Attending Attestation -  This is a shared visit with C Haupt NP.  I had a face-to-face interaction with the patient and independently provided medical decision-making as follows.  I personally rounded with the ICU team, reviewed medications with the pharmacist, discussed the plan of care with bedside RN, and reviewed relevant labs, imaging, and culture data.      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     On exam, HR 70s SBP 140s - looks better today, seated in chair, denies pain, more alert and conversant - FC well, R VF cut, face symmetric, good strength.   Walked in hallway with PT today.      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 s/p embo with R EVD at 11 (191cc output past 24 hrs) - has failed clamp trials x2 most recently 6/30 due to ICP elevation and increased HA - currently at 11 with plan to repeat clamp tomorrow per NSG  - witnessed seizure at onset - continue keppra  - mild hyponatremia - on salt tabs - held florinef due to hypokalemia, monitor I/Os  - pain control - lido patch for neck pain, scheduled tylenol, oxy prn for HA, flexeril vs robaxin prn  - SQH for DVT prophy  - melatonin at bedtime for sleep  - ICU bowel regimen  - personally updated patient and wife at bedside - discussed care plan, all questions addressed  Intervention(s) today include continuing PT/OT and OOB to chair.    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 EMS was called and patient was taken to local ED. There he had witnessed seizure and noted to be flaccid in RHB. He was given 2g Keppra and intubated for airway protection. CTH revealed large left occipital ICH with IVH. He was transferred to [facility] for neurosurgical evaluation.   On arrival to our ED, CTA obtained which was concerning for a cerebral dural AV fistula as etiology for hemorrhage. EVD was placed by NSG and he was taken emergently to IR for embolization of AV fistula. Post-procedure, he was admitted to Neuro ICU.   The following day on 6/23, patient was successfully extubated to room air. Neuro exam improved; patient began moving all extremities to command, with RHB remaining slightly weaker/delayed compared to LHB. Continued to have intermittent agitation post extubation requiring precedex drip, now improved, weaned off on 6/24. Evaluated by speech and cleared for a regular diet. He has been ambulating well with PT. Patient remains in ICU for EVD weaning. Unfortunately, patient failed EVD clamp trial 02/21/24 and 02/24/2024 due to elevated ICPs. Awaiting further EVD plans for NSG.   Events over last 24 hours: Failed clamp trial yesterday, NSG plans to reclamp again tomorrow. Reports improvement in headache since yesterday afternoon (EVD  re-opened). Continues to have right lateral field cut on exam. Denies weakness, numbness, N/V.   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 NSG plans another clamp trial 7/2.    -PT/OT   Headache/pain control: -Scheduled tylenol 1g Q6 X 48 hours (end 7/2) -PRN Oxy 5mg  Q6hr -PRN Robaxin 500mg  4x/d PRN -Lidocaine patch to neck  Pulm:       -Stable on RA   -Encourage IS  Cardio:       -SBP goal < 160   -PRN hydralazine, labetalol    Gastro:       -Regular diet   -Continue ICU Bowel Regimen    FEN/Renal:   Hyponatremia,POA,active -unclear etiology, likely hypovolemic hyponatremia as patient is grossly negative since admission  -Daily BMP, Mag,Phos   -Replace electrolytes per ICU protocol   -Continue salt tabs 2g TID  -If continues to drop will give IVF bolus    -Continue home mag supplement per patient request  ID:       -Monitoring for s/s infection  Endo: A1c 5.3   -Daily glucose monitoring with labs  Hem/Onc:       -SQH for DVT ppx   -Daily CBC to monitor H/h    Lines: PIV x2 EVD   -Continue current lines  Communication:     -Patient updated at bedside     Discharge planning:     -Remain in ICU   Review of Systems: Pertinent positive and negative findings are listed as part of the history of present illness, all other systems were reviewed and are negative.  Objective: Vital signs in last 24 hours: Temp:  [98.5 F (36.9 C)-99.2 F (37.3 C)] 99.2 F (37.3 C) Heart Rate:  [58-90] 59 Resp:  [12-23] 17 BP: (124-169)/(63-88) 141/87   Respiratory/Ventilator Data (if applicable):   Significant Labs or trend in Labs: Lab Results (last 24 hours)     Procedure Component Value Ref Range Date/Time   Basic Metabolic Panel [[id number]]  (Abnormal) Collected: 02/24/24 2215   Lab Status: Final result Specimen: Blood from Venous Updated: 02/24/24 2309    Sodium 133* 136 - 145 mmol/L     Potassium 4.2 3.5 - 5.1 mmol/L     Comment: NO VISIBLE HEMOLYSIS      Chloride 101 98 -  107 mmol/L     CO2 27 21 - 31 mmol/L     Anion Gap 5* 6 - 14 mmol/L     Glucose, Random 97 70 - 99 mg/dL     Blood Urea Nitrogen (BUN) 15 7 - 25 mg/dL     Creatinine 9.17 9.29 - 1.30 mg/dL     eGFR >09 >40 fO/fpw/8.26f7     Comment: GFR estimated by CKD-EPI equations(NKF 2021).   Recommend confirmation of Cr-based eGFR by using Cys-based eGFR and other filtration markers (if applicable) in complex cases and clinical decision-making, as needed.      Calcium 9.0 8.6 - 10.3 mg/dL     BUN/Creatinine Ratio --    Comment: Creatinine is normal, ratio is not clinically indicated.      CBC without Differential [[id number]]  (Abnormal)  Collected: 02/24/24 2215   Lab Status: Final result Specimen: Blood from Venous Updated: 02/24/24 2251   Narrative:     The following orders were created for panel order CBC without Differential. Procedure                               Abnormality         Status                    ---------                               -----------         ------                    CBC without Differential[[id number]]    Abnormal            Final result               Please view results for these tests on the individual orders.   Magnesium [[id number]]  (Normal) Collected: 02/24/24 2215   Lab Status: Final result Specimen: Blood from Venous Updated: 02/24/24 2309    Magnesium 2.5 1.9 - 2.7 mg/dL    Phosphorus [[id number]]  (Normal) Collected: 02/24/24 2215   Lab Status: Final result Specimen: Blood from Venous Updated: 02/24/24 2309    Phosphorus 3.6 2.5 - 5.0 mg/dL    CBC without Differential [[id number]]  (Abnormal) Collected: 02/24/24 2215   Lab Status: Final result Specimen: Blood from Venous Updated: 02/24/24 2251    WBC 8.80 4.40 - 11.00 10*3/uL     RBC 4.18* 4.50 - 5.90 10*6/uL     Hemoglobin 14.0 14.0 - 17.5 g/dL     Hematocrit 60.6* 58.4 - 50.4 %     Mean Corpuscular Volume (MCV) 94.0 80.0 - 96.0 fL     Mean Corpuscular Hemoglobin (MCH) 33.6* 27.5 - 33.2 pg      Mean Corpuscular Hemoglobin Conc (MCHC) 35.8 33.0 - 37.0 g/dL     Red Cell Distribution Width (RDW) 12.9 12.3 - 17.0 %     Platelet Count (PLT) 234 150 - 450 10*3/uL     Mean Platelet Volume (MPV) 8.9 6.8 - 10.2 fL    Basic Metabolic Panel [[id number]]  (Abnormal) Collected: 02/24/24 1301   Lab Status: Final result Specimen: Blood from Venous Updated: 02/24/24 1404    Sodium 131* 136 - 145 mmol/L     Potassium 3.8 3.5 - 5.1 mmol/L     Comment: NO VISIBLE HEMOLYSIS      Chloride 96* 98 - 107 mmol/L     CO2 25 21 - 31 mmol/L     Anion Gap 10 6 - 14 mmol/L     Glucose, Random 112* 70 - 99 mg/dL     Blood Urea Nitrogen (BUN) 9 7 - 25 mg/dL     Creatinine 9.29 9.29 - 1.30 mg/dL     eGFR >09 >40 fO/fpw/8.26f7     Comment: GFR estimated by CKD-EPI equations(NKF 2021).   Recommend confirmation of Cr-based eGFR by using Cys-based eGFR and other filtration markers (if applicable) in complex cases and clinical decision-making, as needed.      Calcium 9.3 8.6 - 10.3 mg/dL     BUN/Creatinine Ratio --    Comment: Creatinine is normal, ratio is not clinically indicated.  Liver Function Panel [[id number]]  (Abnormal) Collected: 02/24/24 1301   Lab Status: Final result Specimen: Blood from Venous Updated: 02/24/24 1439    Albumin 4.5 3.5 - 5.7 g/dL     Bilirubin, Total 1.2* 0.3 - 1.0 mg/dL     Bilirubin, Direct 0.3* 0.0 - 0.2 mg/dL     Alkaline Phosphatase (ALP) 54 34 - 104 U/L     Aspartate Aminotransferase (AST) 34 13 - 39 U/L     Alanine Aminotransferase (ALT) 57* 7 - 52 U/L     Total Protein 6.7 6.4 - 8.9 g/dL    Osmolality [[id number]]  (Normal) Collected: 02/24/24 1301   Lab Status: Final result Specimen: Blood from Venous Updated: 02/24/24 1553    Osmolality 285 276 - 304 mosm/kg    Basic Metabolic Panel [[id number]]  (Abnormal) Collected: 02/24/24 0421   Lab Status: Final result Specimen: Blood from Venous Updated: 02/24/24 0508    Sodium 134* 136 - 145 mmol/L     Potassium 3.2*  3.5 - 5.1 mmol/L     Comment: NO VISIBLE HEMOLYSIS      Chloride 98 98 - 107 mmol/L     CO2 29 21 - 31 mmol/L     Anion Gap 7 6 - 14 mmol/L     Glucose, Random 99 70 - 99 mg/dL     Blood Urea Nitrogen (BUN) 10 7 - 25 mg/dL     Creatinine 9.25 9.29 - 1.30 mg/dL     eGFR >09 >40 fO/fpw/8.26f7     Comment: GFR estimated by CKD-EPI equations(NKF 2021).   Recommend confirmation of Cr-based eGFR by using Cys-based eGFR and other filtration markers (if applicable) in complex cases and clinical decision-making, as needed.      Calcium 9.0 8.6 - 10.3 mg/dL     BUN/Creatinine Ratio --    Comment: Creatinine is normal, ratio is not clinically indicated.          Allergies and current medications given over last 24 hours reviewed.  Physical Exam: GEN: NAD. Sitting in bed eating breakfast.  HEENT: R EVD @ 11, ICP 6, 186cc/24hrs. NECK: Supple. CARDIAC: NSR in 70's. Normal S1, S2, No MGR. No peripheral edema. Extremities warm, well perfused. RESP: CTAB. Normal WOB. Good aeration.  GI: Soft. NT. ND. Normoactive BS.  GU: Voiding.  Neuro Exam:  Alert and Oriented to Self, Place, and Year.  Speech clear and appropriate. Pupils 3mm b/l brisk. Extraocular Movements Intact wo Nystagmus. Face symmetric. Tongue Midline. Motor Strength  Right Left  Shoulder Flexion 5 5  Shoulder Abduction 5 5  Elbow Flexion 5 5  Elbow Extension 5 5  Grip 5 5  Hip Flexion 5 5  Knee Extension 5 5  Ankle Plantar flexors 5 5  Ankle Dorsi flexors 5 5  Sensation grossly intact.   This plan of care was reviewed with and supervised by ICU Attending Luz Dollar, MD.  Total time spent caring for this patient was 35 minutes.  Electronically signed by:  Glendia Peggye First, NP, 02/25/2024 1:56 AM  Electronically signed by: Glendia Peggye First, NP 02/25/2024 1:56 AM  *Some images could not be shown.

## 2024-02-25 NOTE — Progress Notes (Signed)
 Neurosurgery Progress Note Hospital Day: 10   24hr Events: No significant events overnight. Patient denies any headaches.   Objective:  BP 152/79 (BP Location: Right arm, Patient Position: Lying)   Pulse 67   Temp 99.6 F (37.6 C) (Oral)   Resp 16   Ht 1.828 m (5' 11.97)   Wt 76.7 kg (169 lb 1.5 oz)   SpO2 98%   BMI 22.95 kg/m  Lab Results  Component Value Date/Time   HGB 14.0 02/24/2024 2215   WBC 8.80 02/24/2024 2215   PLT 234 02/24/2024 2215   NA 133 (L) 02/24/2024 2215   K 4.2 02/24/2024 2215   BUN 15 02/24/2024 2215   CREATININE 0.82 02/24/2024 2215    Physical Exam: Awake, alert, oriented x 4 Eyes open spontaneously PERRL/EOMI Face symmetric Tongue midline Full strength throughout (Bi/Tri/Del/Grip, HF/KE/DF/PF) No pronator drift  Sensation intact to light touch throughout R EVD in place, ICP 2 at bedside  Assessment/Plan: 63 y.o. male with PMH who presented with seizures and was found to have a left occipital IPH with IVH secondary to a ruptured dAVF now s/p right EVD placement and embolization of dAVF. Will plan for repeat clamp trial tomorrow.    -Continue q4hr neurochecks -Continue R EVD, monitor and record output -Will plan for repeat clamp trial tomorrow  Please send a secure chat to [facility] Neurosurgery Team A with questions or concerns about this patient.  To escalate to the back up resident: go to On Call Finder > [facility] Neurosurgery Admit Consult Team > Secondary or [facility] Resident.  Electronically signed by:  Emeline Royden Aho, MD 02/25/2024 4:18 PM  I have personally seen and examined the patient on 02/25/2024. I agree with the resident note assessment and plan. Clamp tomorrow.  Rockey HERO. Glean, MD, MPH, REENA OLMSTED Associate Professor Department of Neurological Surgery

## 2024-02-26 DIAGNOSIS — I671 Cerebral aneurysm, nonruptured: Secondary | ICD-10-CM | POA: Diagnosis not present

## 2024-02-26 NOTE — Care Plan (Signed)
 Problem: Knowledge Deficit Goal: Patient/family/caregiver demonstrates understanding of disease process, treatment plan, medications, and discharge instructions Description: Complete learning assessment and assess knowledge base. Outcome: Progressing   Problem: Compromised Skin Integrity Goal: Skin integrity is maintained or improved Description: Assess and monitor skin integrity. Identify patients at risk for skin breakdown on admission and per policy. Collaborate with interdisciplinary team and initiate plans and interventions as needed.    Outcome: Progressing Goal: Fluid and electrolyte balance are achieved/maintained Description: Assess and monitor vital signs (orthostatic vitals if applicable), fluid intake and output, urine color, labs, skin turgor, mucous membranes, jugular venous distention, edema, circumference of edematous extremities and abdominal girth, respiratory status, and mental status.  Monitor for signs and symptoms of hypovolemia (tachycardia, rapid breathing, decreased urine output, postural hypotension, confusion, syncope).  Monitor for signs and symptoms of hypervolemia (strong rapid pulse, shortness of breath, difficulty breathing lying down, crackles heard in lung fields, edema). Collaborate with interdisciplinary team and initiate plan and interventions as ordered. Outcome: Progressing Goal: Nutritional status is improving Description: Monitor and assess patient for malnutrition (ex- brittle hair, bruises, dry skin, pale skin and conjunctiva, muscle wasting, smooth red tongue, and disorientation). Collaborate with interdisciplinary team and initiate plan and interventions as ordered.  Monitor patient's weight and dietary intake as ordered or per policy. Utilize nutrition screening tool and intervene per policy. Determine patient's food preferences and provide high-protein, high-caloric foods as appropriate.  Outcome: Progressing   Problem: Urinary Incontinence Goal:  Perineal skin integrity is maintained or improved Description: Assess genitourinary system, perineal skin, labs (urinalysis), and history of incontinence to include past management, aggravating, and alleviating factors.  Collaborate with interdisciplinary team and initiate plans and interventions as needed. Outcome: Progressing   Problem: Health Behavior: Goal: MCB Ability to state ways to decrease the risk of falls will be met by discharge Description: Ability to state ways to decrease the risk of falls will improve by discharge Outcome: Progressing   Problem: Safety: Goal: Will remain free from falls by discharge Description: Will remain free from falls by discharge Outcome: Progressing   Problem: Pain Goal: Improvement in pain assessment Outcome: Progressing   Problem: Risk for Fall-Universal Goal: Fall Prevention During Hospitalization-Universal Outcome: Progressing   Problem: Neurological Deficit Goal: Neurological status is stable or improving Description: Monitor and assess patient's level of consciousness, motor function, sensory function, and level of assistance needed for ADLs.  Monitor and report changes from baseline. Collaborate with interdisciplinary team to initiate plan and implement interventions as ordered.  Outcome: Progressing   Problem: Activity Intolerance/Impaired Mobility Goal: Mobility/activity is maintained at optimum level for patient Description: Assess and monitor patient  barriers to mobility and need for assistive/adaptive devices. Assess patient's emotional response to limitations. Collaborate with interdisciplinary team and initiate plans and interventions as ordered. Outcome: Progressing   Problem: Communication Impairment Goal: Ability to express needs and understand communication Description: Assess patient's communication skills and ability to understand information.  Patient will demonstrate use of effective communication techniques, alternative  methods of communication and understanding even if not able to speak.  Outcome: Progressing   Problem: Nutrition Goal: Nutritional status is improving Description: Monitor and assess patient for malnutrition (ex- brittle hair, bruises, dry skin, pale skin and conjunctiva, muscle wasting, smooth red tongue, and disorientation). Collaborate with interdisciplinary team and initiate plan and interventions as ordered.  Monitor patient's weight and dietary intake as ordered or per policy. Utilize nutrition screening tool and intervene per policy. Determine patient's food preferences and provide high-protein, high-caloric  foods as appropriate.  Outcome: Progressing   Problem: Activity: Goal: Ability to return to normal activity level will improve Description: Ability to return to normal activity level will improve Outcome: Progressing   Problem: Sensory: Goal: General experience of comfort will improve Description: General experience of comfort will improve Outcome: Progressing   Problem: Health Behavior: Goal: Knowledge of disease or condition will improve by discharge Outcome: Progressing Goal: Understanding of proper healthcare maintenance will be met by discharge Description: Understanding of proper healthcare maintenance will be met by discharge Outcome: Progressing Goal: Verbalization of signs and symptoms of infection will improve by discharge Outcome: Progressing   Problem: Coping: Goal: Ability to verbalize feelings will improve by discharge Outcome: Progressing Goal: Ability to verbalize ways to enhance comfort will improve by discharge Outcome: Progressing   Problem: Fluid Volume: Goal: Ability to maintain a balanced intake and output will improve by discharge Description: Ability to maintain a balanced intake and output will improve by discharge Outcome: Progressing   Problem: Health Behavior Goal: Understanding of treatment plan will improve by discharge Outcome:  Progressing Goal: Compliance with treatment plan for underlying cause of condition will improve by discharge Outcome: Progressing Goal: Identification of resources available to assist in meeting health care needs will improve by discharge Outcome: Progressing Goal: Ability to care for self will improve by discharge Outcome: Progressing   Problem: Urinary Elimination: Goal: Ability to recognize the need to void and respond appropriately will improve by discharge Outcome: Progressing Goal: Ability to completely empty bladder with each voiding will improve by discharge Outcome: Progressing Goal: Will remain free from infection by discharge Outcome: Progressing

## 2024-02-27 DIAGNOSIS — G911 Obstructive hydrocephalus: Secondary | ICD-10-CM | POA: Diagnosis not present

## 2024-02-27 DIAGNOSIS — I671 Cerebral aneurysm, nonruptured: Secondary | ICD-10-CM | POA: Diagnosis not present

## 2024-02-27 DIAGNOSIS — I615 Nontraumatic intracerebral hemorrhage, intraventricular: Secondary | ICD-10-CM | POA: Diagnosis not present

## 2024-02-27 DIAGNOSIS — I611 Nontraumatic intracerebral hemorrhage in hemisphere, cortical: Secondary | ICD-10-CM | POA: Diagnosis not present

## 2024-02-28 DIAGNOSIS — G911 Obstructive hydrocephalus: Secondary | ICD-10-CM | POA: Diagnosis not present

## 2024-02-28 DIAGNOSIS — Z982 Presence of cerebrospinal fluid drainage device: Secondary | ICD-10-CM | POA: Diagnosis not present

## 2024-02-28 DIAGNOSIS — I671 Cerebral aneurysm, nonruptured: Secondary | ICD-10-CM | POA: Diagnosis not present

## 2024-02-28 DIAGNOSIS — I615 Nontraumatic intracerebral hemorrhage, intraventricular: Secondary | ICD-10-CM | POA: Diagnosis not present

## 2024-02-28 DIAGNOSIS — I611 Nontraumatic intracerebral hemorrhage in hemisphere, cortical: Secondary | ICD-10-CM | POA: Diagnosis not present

## 2024-02-29 DIAGNOSIS — E871 Hypo-osmolality and hyponatremia: Secondary | ICD-10-CM | POA: Diagnosis not present

## 2024-02-29 DIAGNOSIS — F05 Delirium due to known physiological condition: Secondary | ICD-10-CM | POA: Diagnosis not present

## 2024-02-29 DIAGNOSIS — I671 Cerebral aneurysm, nonruptured: Secondary | ICD-10-CM | POA: Diagnosis not present

## 2024-03-04 NOTE — Therapy (Signed)
 OUTPATIENT OCCUPATIONAL THERAPY NEURO EVALUATION  Patient Name: Dakota Flores MRN: [id number] DOB:05-10-1961, 63 y.o., male Today's Date: 03/04/2024  PCP: PIERRETTE REFERRING PROVIDER: Valora Tawni BIRCH, PA-C  END OF SESSION:   Past Medical History:  Diagnosis Date   Depression    Past Surgical History:  Procedure Laterality Date   COLONOSCOPY  08/2017   LASIK  2005   VASECTOMY     WISDOM TOOTH EXTRACTION     There are no active problems to display for this patient.   ONSET DATE: 03/03/2024 (referral date)   REFERRING DIAG: I67.1 (ICD-10-CM) - Cerebral aneurysm, nonruptured  THERAPY DIAG:  No diagnosis found.  Rationale for Evaluation and Treatment: Rehabilitation  SUBJECTIVE:   SUBJECTIVE STATEMENT: *** Pt accompanied by: {accompnied:27141}  PERTINENT HISTORY:  63 y.o. 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 {OPRCPAIN:27236}  FALLS: Has patient fallen in last 6 months? {fallsyesno:27318}  LIVING ENVIRONMENT: Lives with: {OPRC lives with:25569::lives with their family} Lives in: {Lives in:25570} Stairs: {opstairs:27293} Has following equipment at home: {Assistive devices:23999}  PLOF: {PLOF:24004}  PATIENT GOALS: ***  OBJECTIVE:  Note: Objective measures were completed at Evaluation unless otherwise noted.  HAND DOMINANCE: {MISC; OT HAND DOMINANCE:[phone]}  ADLs: Overall ADLs: *** Transfers/ambulation related to ADLs: Eating: *** Grooming: *** UB Dressing: *** LB Dressing: *** Toileting: *** Bathing: *** Tub  Shower transfers: *** Equipment: {equipment:25573}  IADLs: Shopping: *** Light housekeeping: *** Meal Prep: *** Community mobility: *** Medication management: *** Financial management: *** Handwriting: {OTWRITTENEXPRESSION:25361}  MOBILITY STATUS: {OTMOBILITY:25360}  POSTURE COMMENTS:  {posture:25561} Sitting balance: {sitting balance:25483}  ACTIVITY TOLERANCE: Activity tolerance: ***  FUNCTIONAL OUTCOME MEASURES: {OTFUNCTIONALMEASURES:27238}  UPPER EXTREMITY ROM:    {AROM/PROM:27142} ROM Right eval Left eval  Shoulder flexion    Shoulder abduction    Shoulder adduction    Shoulder extension    Shoulder internal rotation    Shoulder external rotation    Elbow flexion    Elbow extension    Wrist flexion    Wrist extension    Wrist ulnar deviation    Wrist radial deviation    Wrist pronation    Wrist supination    (Blank rows = not tested)  UPPER EXTREMITY MMT:     MMT Right eval Left eval  Shoulder flexion    Shoulder abduction    Shoulder adduction    Shoulder extension    Shoulder internal rotation    Shoulder external rotation    Middle trapezius    Lower trapezius    Elbow flexion    Elbow extension    Wrist flexion    Wrist extension    Wrist ulnar deviation    Wrist radial deviation    Wrist pronation    Wrist supination    (Blank rows = not tested)  HAND FUNCTION: {handfunction:27230}  COORDINATION: {otcoordination:27237}  SENSATION: {sensation:27233}  EDEMA: ***  MUSCLE TONE: {UETONE:25567}  COGNITION: Overall cognitive status: {cognition:24006}  VISION: Subjective report: *** Baseline vision: {OTBASELINEVISION:25363} Visual history: {OTVISUALHISTORY:25364}  VISION ASSESSMENT: {visionassessment:27231}  Patient has difficulty with following activities due to following visual impairments: ***  PERCEPTION: {Perception:25564}  PRAXIS: {Praxis:25565}  OBSERVATIONS: ***  TREATMENT DATE: ***         PATIENT EDUCATION: Education details: *** Person educated: {Person educated:25204} Education method: {Education Method:25205} Education comprehension: {Education Comprehension:25206}  HOME EXERCISE PROGRAM: ***   GOALS: Goals reviewed with patient? {yes/no:20286}  SHORT TERM GOALS: Target date: ***  *** Baseline: Goal status: INITIAL  2.  *** Baseline:  Goal status: INITIAL  3.  *** Baseline:  Goal status: INITIAL  4.  *** Baseline:  Goal status: INITIAL  5.  *** Baseline:  Goal status: INITIAL  6.  *** Baseline:  Goal status: INITIAL  LONG TERM GOALS: Target date: ***  *** Baseline:  Goal status: INITIAL  2.  *** Baseline:  Goal status: INITIAL  3.  *** Baseline:  Goal status: INITIAL  4.  *** Baseline:  Goal status: INITIAL  5.  *** Baseline:  Goal status: INITIAL  6.  *** Baseline:  Goal status: INITIAL  ASSESSMENT:  CLINICAL IMPRESSION: Patient is a *** y.o. *** who was seen today for occupational therapy evaluation for ***.   PERFORMANCE DEFICITS: in functional skills including {OT physical skills:25468}, cognitive skills including {OT cognitive skills:25469}, and psychosocial skills including {OT psychosocial skills:25470}.   IMPAIRMENTS: are limiting patient from {OT performance deficits:25471}.   CO-MORBIDITIES: {Comorbidities:25485} that affects occupational performance. Patient will benefit from skilled OT to address above impairments and improve overall function.  MODIFICATION OR ASSISTANCE TO COMPLETE EVALUATION: {OT modification:25474}  OT OCCUPATIONAL PROFILE AND HISTORY: {OT PROFILE AND HISTORY:25484}  CLINICAL DECISION MAKING: {OT CDM:25475}  REHAB POTENTIAL: {rehabpotential:25112}  EVALUATION COMPLEXITY: {Evaluation complexity:25115}    PLAN:  OT FREQUENCY: {rehab frequency:25116}  OT DURATION:  {rehab duration:25117}  PLANNED INTERVENTIONS: {OT Interventions:25467}  RECOMMENDED OTHER SERVICES: ***  CONSULTED AND AGREED WITH PLAN OF CARE: {ENR:74513}  PLAN FOR NEXT SESSION: ***   Burnard JINNY Roads, OT 03/04/2024, 4:43 PM

## 2024-03-05 ENCOUNTER — Ambulatory Visit: Attending: Physician Assistant | Admitting: Physical Therapy

## 2024-03-05 ENCOUNTER — Telehealth: Payer: Self-pay | Admitting: Occupational Therapy

## 2024-03-05 ENCOUNTER — Ambulatory Visit: Admitting: Occupational Therapy

## 2024-03-05 VITALS — BP 131/84 | HR 73

## 2024-03-05 DIAGNOSIS — R2681 Unsteadiness on feet: Secondary | ICD-10-CM | POA: Diagnosis not present

## 2024-03-05 DIAGNOSIS — M542 Cervicalgia: Secondary | ICD-10-CM | POA: Insufficient documentation

## 2024-03-05 DIAGNOSIS — R480 Dyslexia and alexia: Secondary | ICD-10-CM | POA: Diagnosis not present

## 2024-03-05 DIAGNOSIS — I69118 Other symptoms and signs involving cognitive functions following nontraumatic intracerebral hemorrhage: Secondary | ICD-10-CM | POA: Diagnosis not present

## 2024-03-05 DIAGNOSIS — R41842 Visuospatial deficit: Secondary | ICD-10-CM

## 2024-03-05 DIAGNOSIS — R41841 Cognitive communication deficit: Secondary | ICD-10-CM | POA: Diagnosis not present

## 2024-03-05 DIAGNOSIS — R278 Other lack of coordination: Secondary | ICD-10-CM | POA: Diagnosis not present

## 2024-03-05 DIAGNOSIS — M6281 Muscle weakness (generalized): Secondary | ICD-10-CM | POA: Insufficient documentation

## 2024-03-05 NOTE — Telephone Encounter (Signed)
 Patients' son, Emeterio has called in to ask if FMLA forms have been received. He is looking to get a call back if so.  [phone]

## 2024-03-05 NOTE — Telephone Encounter (Signed)
 Made in error

## 2024-03-05 NOTE — Therapy (Signed)
 OUTPATIENT PHYSICAL THERAPY NEURO EVALUATION   Patient Name: Dakota Flores MRN: [id number] DOB:08-Jul-1961, 63 y.o., male Today's Date: 03/05/2024   PCP: Cristopher Bottcher, NP REFERRING PROVIDER: Valora Tawni BIRCH, PA-C  END OF SESSION:  PT End of Session - 03/05/24 0932     Visit Number 1    Number of Visits 5    Date for PT Re-Evaluation 04/16/24    Authorization Type [employer]    Authorization Time Period 25 VL combined (4 used prior to eval)    PT Start Time 0930    PT Stop Time 1015    PT Time Calculation (min) 45 min    Equipment Utilized During Treatment Gait belt    Activity Tolerance Patient tolerated treatment well    Behavior During Therapy WFL for tasks assessed/performed          Past Medical History:  Diagnosis Date   Depression    Past Surgical History:  Procedure Laterality Date   COLONOSCOPY  08/2017   LASIK  2005   VASECTOMY     WISDOM TOOTH EXTRACTION     There are no active problems to display for this patient.   ONSET DATE: 03/03/2024 (referral date)  REFERRING DIAG: I67.1 (ICD-10-CM) - Cerebral aneurysm, nonruptured  THERAPY DIAG:  Muscle weakness (generalized)  Cervicalgia  Unsteadiness on feet  Rationale for Evaluation and Treatment: Rehabilitation  SUBJECTIVE:                                                                                                                                                                                             SUBJECTIVE STATEMENT:  Dakota Flores  Pt reports that he is getting better every day. Pt reports that prior to his incident he was very physically active, enjoyed playing tennis, etc. He used a standing desk at work and was rarely sedentary. Since coming home from the hospital he was been walking 15-20 min and is now up to 30 min walks. He feels like his balance is good and that physically he is pretty much back to baseline.  Pt does have ongoing headaches as well as tightness in his neck and  shoulders. He reports that he is unable to read due to the R [facility], also has some trouble with comprehension. He does report that his vision has gotten better over the past week.  Prior to his injury patient worked as an Pensions consultant for [employer], currently on medical leave.  Pt accompanied by: self and significant other [name]  PERTINENT HISTORY: Dakota Flores is a 63 y.o. 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   PAIN:  Are you having pain? Yes: NPRS scale: 4/10 Pain location: headache, L frontal area, very temporal Pain description: gets once per day, spotty - comes and goes Aggravating factors: N/A Relieving factors: goes away in a few minutes if he relaxes, Tylenol  PRECAUTIONS: Other: 10# lifting restriction, avoid exertion/strenuous exercise, NO DRIVING (R homonymous hemianopsia), seizures  RED FLAGS: None   WEIGHT BEARING RESTRICTIONS: No  FALLS: Has patient fallen in last 6 months? No  LIVING ENVIRONMENT: Lives with: lives with their spouse Lives in: House/apartment Stairs: 2-3 STE, no issues with stairs; can access all needs on main level of home Has following equipment at home: None  PLOF: Independent  PATIENT GOALS: 2 things: improve posture and improve range of my neck and shoulders  OBJECTIVE:  Note: Objective measures were completed at Evaluation unless otherwise noted.  DIAGNOSTIC FINDINGS: See Chart (from outside hospital)  COGNITION: Overall cognitive status: Impaired, impaired comprehension   SENSATION: Pt reports no N/T in either UE or LE, does occasionally feel that sensation is different in his R side as compared to his L side Proprioception appears mildly impaired during  functional testing  COORDINATION: WFL  POSTURE: forward head and increased thoracic kyphosis  CERVICAL ROM:   Active ROM A/PROM (deg) eval  Flexion 30  Extension 27  Right lateral flexion 12  Left lateral flexion 20  Right rotation 40  Left rotation 45   (Blank rows = not tested)   LOWER EXTREMITY ROM:     Active  Right Eval Left Eval  Hip flexion    Hip extension    Hip abduction    Hip adduction    Hip internal rotation    Hip external rotation    Knee flexion    Knee extension Tight HS Tight HS  Ankle dorsiflexion    Ankle plantarflexion    Ankle inversion    Ankle eversion     (Blank rows = not tested)  LOWER EXTREMITY MMT:    MMT Right Eval Left Eval  Hip flexion 4 4  Hip extension    Hip abduction    Hip adduction    Hip internal rotation    Hip external rotation    Knee flexion 4 4  Knee extension 4 4  Ankle dorsiflexion 4 4  Ankle plantarflexion    Ankle inversion    Ankle eversion    (Blank rows = not tested)  BED MOBILITY:  Mod I per patient report, ok to lay flat, sidesleeper  TRANSFERS: Sit to stand: Modified independence  Assistive device utilized: None     Stand to sit: Modified independence  Assistive device utilized: None     Chair to chair: Modified independence  Assistive device utilized: None       RAMP:  Not tested  CURB:  Not tested  STAIRS:  STAIRS:  Level of Assistance: Modified independence  Stair Negotiation Technique: Alternating Pattern  with No Rails  Number of Stairs: 4   Height of Stairs: 6  Comments: WFL   GAIT: Findings:  Gait pattern: WFL Distance walked: various clinic distances Assistive device utilized: None Level of assistance: Modified independence Comments: WFL, slightly decreased speed   FUNCTIONAL TESTS:    [facility] PT Assessment - 03/05/24 0957  Ambulation/Gait   Gait velocity 32.8 ft over 7.41 sec = 4.43 ft/sec      Standardized Balance Assessment   Standardized Balance  Assessment Timed Up and Go Test;Five Times Sit to Stand    Five times sit to stand comments  11.16 sec   no UE     Timed Up and Go Test   TUG Normal TUG    Normal TUG (seconds) 8.7   no AD     Functional Gait  Assessment   Gait assessed  Yes    Gait Level Surface Walks 20 ft in less than 5.5 sec, no assistive devices, good speed, no evidence for imbalance, normal gait pattern, deviates no more than 6 in outside of the 12 in walkway width.    Change in Gait Speed Able to smoothly change walking speed without loss of balance or gait deviation. Deviate no more than 6 in outside of the 12 in walkway width.    Gait with Horizontal Head Turns Performs head turns smoothly with no change in gait. Deviates no more than 6 in outside 12 in walkway width    Gait with Vertical Head Turns Performs head turns with no change in gait. Deviates no more than 6 in outside 12 in walkway width.    Gait and Pivot Turn Pivot turns safely within 3 sec and stops quickly with no loss of balance.    Step Over Obstacle Is able to step over 2 stacked shoe boxes taped together (9 in total height) without changing gait speed. No evidence of imbalance.    Gait with Narrow Base of Support Ambulates 7-9 steps.    Gait with Eyes Closed Walks 20 ft, no assistive devices, good speed, no evidence of imbalance, normal gait pattern, deviates no more than 6 in outside 12 in walkway width. Ambulates 20 ft in less than 7 sec.    Ambulating Backwards Walks 20 ft, no assistive devices, good speed, no evidence for imbalance, normal gait    Steps Alternating feet, no rail.    Total Score 29    FGA comment: 29/30                                                                                                                                     TREATMENT DATE:  PT Evaluation  TherEx To address tightness in cervical and upper shoulder muscles: Seated UT stretch 3 x 30 sec each B Seated levator scap stretch 3 x 30 sec each B  Added to  initial HEP, see bolded below   PATIENT EDUCATION: Education details: Eval findings, PT POC, results of OM and functional implications, initial HEP Person educated: Patient and Spouse Education method: Explanation, Demonstration, and Handouts Education comprehension: verbalized understanding, returned demonstration, and needs further education  HOME EXERCISE PROGRAM: Access Code: [email or web address] URL: [email or web address] Date: 03/05/2024 Prepared by: Waddell Southgate  Exercises - Seated Upper Trapezius Stretch  - 1  x daily - 7 x weekly - 1 sets - 3-5 reps - 30 sec hold - Gentle Levator Scapulae Stretch  - 1 x daily - 7 x weekly - 1 sets - 3-5 reps - 30 sec hold  GOALS: Goals reviewed with patient? Yes  SHORT TERM GOALS=LONG TERM GOALS due to length of POC   LONG TERM GOALS: Target date: 04/02/2024  Pt will be independent with final HEP for improved cervical ROM, improved posture and management of pain symptoms in order to build upon functional gains made in therapy.  Baseline:  Goal status: INITIAL  2.  Pt will increase his cervical AROM by >/= 5 degrees in limited motions to demonstrate improved function. Baseline:  Active ROM A/PROM (deg) eval  Flexion 30  Extension 27  Right lateral flexion 12  Left lateral flexion 20  Right rotation 40  Left rotation 45    Goal status: INITIAL  3.  Pt will demonstrate compensatory strategies for his R homonymous hemianopsia in order to improve his safety and independence with functional mobility. Baseline:  Goal status: INITIAL    ASSESSMENT:  CLINICAL IMPRESSION: Patient is a 63 year old male referred to Neuro OPPT for a cerebral aneurysm.   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 The following deficits were present during the exam: decreased LE strength, tightness in  hamstrings, decreased cervical AROM, increased pain in neck and shoulder region, postural dysfunction and visual impairments due to R homonymous hemianopsia with possible cognitive impairments as well. Based on his visual impairments and cognitive impairments, pt is an increased risk for falls. Pt would benefit from skilled PT to address these impairments and functional limitations to maximize functional mobility independence.    OBJECTIVE IMPAIRMENTS: decreased cognition, decreased knowledge of condition, decreased ROM, decreased strength, impaired vision/preception, improper body mechanics, postural dysfunction, and pain.   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 are also affecting patient's functional outcome.   REHAB POTENTIAL: Good  CLINICAL DECISION MAKING: Evolving/moderate complexity  EVALUATION COMPLEXITY: Moderate  PLAN:  PT FREQUENCY: 1x/week  PT DURATION: 4 weeks  PLANNED INTERVENTIONS: 97164- PT Re-evaluation, 97750- Physical Performance Testing, 97110-Therapeutic exercises, 97530- Therapeutic activity, 97112- Neuromuscular re-education, 97535- Self Care, 02859- Manual therapy, [phone]- Gait training, [phone]- Electrical stimulation (manual), [phone] (1-2 muscles), 20561 (3+ muscles)- Dry Needling, Patient/Family education, Balance training, Stair training, Taping, Joint mobilization, Spinal mobilization, Visual/preceptual remediation/compensation, Cognitive remediation, DME instructions, Cryotherapy, and Moist heat  PLAN FOR NEXT SESSION: scapular strengthening, cervical and shoulder stretching, address R HH and compensatory strategies   Waddell Southgate, PT Dayla Gasca, PT, DPT,  CSRS  03/05/2024, 4:10 PM

## 2024-03-06 DIAGNOSIS — E871 Hypo-osmolality and hyponatremia: Secondary | ICD-10-CM | POA: Diagnosis not present

## 2024-03-06 DIAGNOSIS — R4189 Other symptoms and signs involving cognitive functions and awareness: Secondary | ICD-10-CM | POA: Diagnosis not present

## 2024-03-06 DIAGNOSIS — H5461 Unqualified visual loss, right eye, normal vision left eye: Secondary | ICD-10-CM | POA: Diagnosis not present

## 2024-03-06 DIAGNOSIS — R001 Bradycardia, unspecified: Secondary | ICD-10-CM | POA: Diagnosis not present

## 2024-03-06 DIAGNOSIS — I671 Cerebral aneurysm, nonruptured: Secondary | ICD-10-CM | POA: Diagnosis not present

## 2024-03-06 NOTE — Telephone Encounter (Signed)
 Message to Darice Fell, RN.

## 2024-03-09 ENCOUNTER — Encounter: Payer: Self-pay | Admitting: Speech Pathology

## 2024-03-09 ENCOUNTER — Ambulatory Visit: Admitting: Physical Therapy

## 2024-03-09 ENCOUNTER — Ambulatory Visit: Admitting: Speech Pathology

## 2024-03-09 DIAGNOSIS — M542 Cervicalgia: Secondary | ICD-10-CM | POA: Diagnosis not present

## 2024-03-09 DIAGNOSIS — R2681 Unsteadiness on feet: Secondary | ICD-10-CM

## 2024-03-09 DIAGNOSIS — R41841 Cognitive communication deficit: Secondary | ICD-10-CM | POA: Diagnosis not present

## 2024-03-09 DIAGNOSIS — M6281 Muscle weakness (generalized): Secondary | ICD-10-CM

## 2024-03-09 DIAGNOSIS — R41842 Visuospatial deficit: Secondary | ICD-10-CM | POA: Diagnosis not present

## 2024-03-09 DIAGNOSIS — I69118 Other symptoms and signs involving cognitive functions following nontraumatic intracerebral hemorrhage: Secondary | ICD-10-CM | POA: Diagnosis not present

## 2024-03-09 DIAGNOSIS — R480 Dyslexia and alexia: Secondary | ICD-10-CM

## 2024-03-09 DIAGNOSIS — R278 Other lack of coordination: Secondary | ICD-10-CM | POA: Diagnosis not present

## 2024-03-09 NOTE — Therapy (Signed)
 OUTPATIENT PHYSICAL THERAPY NEURO TREATMENT   Patient Name: Dakota Flores MRN: [id number] DOB:Jan 19, 1961, 63 y.o., male Today's Date: 03/09/2024   PCP: Dakota Bottcher, NP REFERRING PROVIDER: Valora Tawni BIRCH, PA-C  END OF SESSION:  PT End of Session - 03/09/24 0853     Visit Number 2    Number of Visits 5    Date for PT Re-Evaluation 04/16/24    Authorization Type [employer]    Authorization Time Period 25 VL combined (4 used prior to eval)    PT Start Time 0850   pt arrived late   PT Stop Time 0930    PT Time Calculation (min) 40 min    Equipment Utilized During Treatment Gait belt    Activity Tolerance Patient tolerated treatment well    Behavior During Therapy [facility] for tasks assessed/performed           Past Medical History:  Diagnosis Date   Depression    Past Surgical History:  Procedure Laterality Date   COLONOSCOPY  08/2017   LASIK  2005   VASECTOMY     WISDOM TOOTH EXTRACTION     There are no active problems to display for this patient.   ONSET DATE: 03/03/2024 (referral date)  REFERRING DIAG: I67.1 (ICD-10-CM) - Cerebral aneurysm, nonruptured  THERAPY DIAG:  Visuospatial deficit  Other lack of coordination  Muscle weakness (generalized)  Cervicalgia  Unsteadiness on feet  Rationale for Evaluation and Treatment: Rehabilitation  SUBJECTIVE:                                                                                                                                                                                             SUBJECTIVE STATEMENT:  Dakota Flores  Pt reports he had a minor setback yesterday with regards to trying to walk outdoors, it was too hot. No pain today, no falls since last visit. He reports that his vision going about the same. He did get an order for speech therapy.  Agreeable to schedule 1-2 more PT sessions beyond today.  Pt had been doing doorway stretch but had stopped prior to his stroke and as a result has noticed  more tightness in his chest and shoulder area.  Pt accompanied by: self and significant other wife Dakota Flores  PERTINENT HISTORY: Dakota Flores is a 63 y.o. 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   PAIN:  Are you having pain?  Yes: NPRS scale: 4/10 Pain location: headache, L frontal area, very temporal Pain description: gets once per day, spotty - comes and goes Aggravating factors: N/A Relieving factors: goes away in a few minutes if he relaxes, Tylenol  PRECAUTIONS: Other: 10# lifting restriction, avoid exertion/strenuous exercise, NO DRIVING (R homonymous hemianopsia), seizures  RED FLAGS: None   WEIGHT BEARING RESTRICTIONS: No  FALLS: Has patient fallen in last 6 months? No  LIVING ENVIRONMENT: Lives with: lives with their spouse Lives in: House/apartment Stairs: 2-3 STE, no issues with stairs; can access all needs on main level of home Has following equipment at home: None  PLOF: Independent  PATIENT GOALS: 2 things: improve posture and improve range of my neck and shoulders  OBJECTIVE:  Note: Objective measures were completed at Evaluation unless otherwise noted.  DIAGNOSTIC FINDINGS: See Chart (from outside hospital)  COGNITION: Overall cognitive status: Impaired, impaired comprehension   SENSATION: Pt reports no N/T in either UE or LE, does occasionally feel that sensation is different in his R side as compared to his L side Proprioception appears mildly impaired during functional testing  COORDINATION: WFL  POSTURE: forward head and increased thoracic kyphosis  CERVICAL ROM:   Active ROM A/PROM (deg) eval  Flexion 30  Extension 27  Right lateral flexion 12  Left lateral flexion 20  Right  rotation 40  Left rotation 45   (Blank rows = not tested)   LOWER EXTREMITY ROM:     Active  Right Eval Left Eval  Hip flexion    Hip extension    Hip abduction    Hip adduction    Hip internal rotation    Hip external rotation    Knee flexion    Knee extension Tight HS Tight HS  Ankle dorsiflexion    Ankle plantarflexion    Ankle inversion    Ankle eversion     (Blank rows = not tested)  LOWER EXTREMITY MMT:    MMT Right Eval Left Eval  Hip flexion 4 4  Hip extension    Hip abduction    Hip adduction    Hip internal rotation    Hip external rotation    Knee flexion 4 4  Knee extension 4 4  Ankle dorsiflexion 4 4  Ankle plantarflexion    Ankle inversion    Ankle eversion    (Blank rows = not tested)  BED MOBILITY:  Mod I per patient report, ok to lay flat, sidesleeper  TRANSFERS: Sit to stand: Modified independence  Assistive device utilized: None     Stand to sit: Modified independence  Assistive device utilized: None     Chair to chair: Modified independence  Assistive device utilized: None       RAMP:  Not tested  CURB:  Not tested  STAIRS:  STAIRS:  Level of Assistance: Modified independence  Stair Negotiation Technique: Alternating Pattern  with No Rails  Number of Stairs: 4   Height of Stairs: 6  Comments: WFL   GAIT: Findings:  Gait pattern: WFL Distance walked: various clinic distances Assistive device utilized: None Level of assistance: Modified independence Comments: WFL, slightly decreased speed   FUNCTIONAL TESTS:  TREATMENT DATE:    TherEx To address muscle tightness and weakness in cervical and upper shoulder muscles: At the wall: Forwards wall slides/shoulder flexion x 10 reps with 5-10 sec hold Lateral wall slides/shoulder abduction x 10 reps with 5-10 sec hold B Supine on mat  table: Cervical retraction x 10 reps with 5 sec hold into towel roll Prone on mat table: Attempt shoulder flexion I, pt unable to lift arms against gravity Shoulder scapular retraction W x 10 reps Shoulder scaption Y x 10 reps  Added appropriate exercises to HEP, see bolded below    PATIENT EDUCATION: Education details: continue HEP and added to HEP Person educated: Patient and Spouse Education method: Explanation, Facilities manager, and Handouts Education comprehension: verbalized understanding, returned demonstration, and needs further education  HOME EXERCISE PROGRAM: Access Code: [email or web address] URL: [email or web address] Date: 03/05/2024 Prepared by: Dimitri Dsouza  Exercises - Seated Upper Trapezius Stretch  - 1 x daily - 7 x weekly - 1 sets - 3-5 reps - 30 sec hold - Gentle Levator Scapulae Stretch  - 1 x daily - 7 x weekly - 1 sets - 3-5 reps - 30 sec hold - Supine Chin Tuck with Towel  - 1 x daily - 7 x weekly - 3 sets - 10 reps - 5 sec hold - Standing Shoulder Abduction Slides at Wall  - 1 x daily - 7 x weekly - 3 sets - 10 reps - Standing shoulder flexion wall slides  - 1 x daily - 7 x weekly - 3 sets - 10 reps - Prone W Scapular Retraction  - 1 x daily - 7 x weekly - 3 sets - 10 reps - 5 sec hold - Prone Scapular Retraction Y  - 1 x daily - 7 x weekly - 3 sets - 10 reps - 5 sec hold  GOALS: Goals reviewed with patient? Yes  SHORT TERM GOALS=LONG TERM GOALS due to length of POC   LONG TERM GOALS: Target date: 04/02/2024  Pt will be independent with final HEP for improved cervical ROM, improved posture and management of pain symptoms in order to build upon functional gains made in therapy.  Baseline:  Goal status: INITIAL  2.  Pt will increase his cervical AROM by >/= 5 degrees in limited motions to demonstrate improved function. Baseline:  Active ROM A/PROM (deg) eval  Flexion 30  Extension 27  Right lateral flexion 12  Left lateral flexion 20   Right rotation 40  Left rotation 45    Goal status: INITIAL  3.  Pt will demonstrate compensatory strategies for his R homonymous hemianopsia in order to improve his safety and independence with functional mobility. Baseline:  Goal status: INITIAL    ASSESSMENT:  CLINICAL IMPRESSION: Emphasis of skilled PT session on trialing various exercises to see what would be appropriate to add to patient's HEP to address his ongoing pain and muscle tightness in cervical and shoulder region. Pt with good response to exercises performed this session, does have difficulty performing prone shoulder flexion due to muscle weakness. He continues to benefit from skilled PT services to work towards increased independence with management of his pain symptoms and for improved function. Continue POC.     OBJECTIVE IMPAIRMENTS: decreased cognition, decreased knowledge of condition, decreased ROM, decreased strength, impaired vision/preception, improper body mechanics, postural dysfunction, and pain.   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 are also affecting patient's functional outcome.   REHAB POTENTIAL: Good  CLINICAL DECISION MAKING: Evolving/moderate complexity  EVALUATION COMPLEXITY: Moderate  PLAN:  PT FREQUENCY: 1x/week  PT DURATION: 4 weeks  PLANNED INTERVENTIONS: 97164- PT Re-evaluation, 97750- Physical Performance Testing, 97110-Therapeutic exercises, 97530- Therapeutic activity, [id number]- Neuromuscular re-education, 97535- Self Care, [id number]- Manual therapy, [id number]- Gait training, [phone]- Electrical stimulation (manual), [phone] (1-2 muscles), [id number] (3+ muscles)- Dry Needling, Patient/Family education,  Balance training, Stair training, Taping, Joint mobilization, Spinal mobilization, Visual/preceptual remediation/compensation, Cognitive remediation, DME instructions, Cryotherapy, and Moist heat  PLAN FOR NEXT SESSION: how is initial HEP? Add cervical stretches (towel with rotation, distraction), seated shoulder flexion, seated rows,  scapular strengthening, cervical and shoulder stretching, address R HH and compensatory strategies   Waddell Southgate, PT Waddell Southgate, PT, DPT, CSRS  03/09/2024, 11:24 AM

## 2024-03-09 NOTE — Therapy (Signed)
 OUTPATIENT SPEECH LANGUAGE PATHOLOGY EVALUATION   Patient Name: Dakota Flores MRN: [id number] DOB:15-Jul-1961, 63 y.o., male Today's Date: 03/09/2024  PCP: Dakota Bottcher, NP REFERRING PROVIDER: Cristopher Bottcher, NP  END OF SESSION:  End of Session - 03/09/24 1242     Visit Number 1    Number of Visits 9    Date for SLP Re-Evaluation 05/04/24   extended due to scheduling   Authorization Type 25 total for all disciplines    Authorization - Number of Visits 25    SLP Start Time 1102    SLP Stop Time  1145    SLP Time Calculation (min) 43 min    Activity Tolerance Patient tolerated treatment well          Past Medical History:  Diagnosis Date   Depression    Past Surgical History:  Procedure Laterality Date   COLONOSCOPY  08/2017   LASIK  2005   VASECTOMY     WISDOM TOOTH EXTRACTION     There are no active problems to display for this patient.   ONSET DATE: 02/16/24   REFERRING DIAG: R41.89 (ICD-10-CM) - Other symptoms and signs involving cognitive functions and awareness  THERAPY DIAG:  Cognitive communication deficit  Dyslexia and alexia  Rationale for Evaluation and Treatment: Rehabilitation  SUBJECTIVE:   SUBJECTIVE STATEMENT: He is good mentally, it's the reading processing Pt accompanied by: significant other, Dakota Flores  PERTINENT HISTORY: 63 y.o. 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 No  FALLS: Has patient fallen in last 6 months?  See PT evaluation for details  LIVING ENVIRONMENT: Lives with: lives with their spouse Lives in: House/apartment  PLOF:  Level of assistance: Independent with ADLs, Independent  with IADLs Employment: Data processing manager for [employer]  PATIENT GOALS: To return to work if able  OBJECTIVE:  Note: Objective measures were completed at Evaluation unless otherwise noted.   COGNITION: Overall cognitive status: Within functional limits for tasks assessed Areas of impairment:   Functional deficits: reading comprehension/processing  COGNITIVE COMMUNICATION: Following directions: Follows multi-step commands consistently  Auditory comprehension: WFL Verbal expression: WFL Functional communication: Impaired: unable to read/comprehend texts and e mails  ORAL MOTOR EXAMINATION: Overall status: WFL Comments:   STANDARDIZED ASSESSMENTS: Subtests of RCBA: Word-Visual - 8/10 - extended time consistently Word-Semantic 8/10 - extended time consistently Sentence-Picture 0/3 - not completed due to extended time   PATIENT REPORTED OUTCOME MEASURES (PROM): Cognitive Function: 133/140 - all items a 5 or no difficulty except a 2 (a lot of difficulty_) reading complex instructions and a 1,very often difficulty reading something several times to understand it  TREATMENT DATE:   Consider assessment of numbers next session  03/10/23: Eval only due to time constraints    PATIENT EDUCATION: Education details: areas of impairment, goals Person educated: Patient and Spouse Education method: Explanation and Verbal cues Education comprehension: needs further education   GOALS: Goals reviewed with patient? Yes  SHORT TERM GOALS: Target date: 04/20/24  Pt will scan left to right to see 3-4 words on page with rare min A Baseline: Goal status: INITIAL  2.  Pt will ID word f:4 with extended time 4/5x  Baseline:  Goal status: INITIAL  3.  Pt will read and comprehend 5 -7 word text with rare min A and extended time Baseline:  Goal status:  INITIAL  4.  Pt will generate and edit 3 sentence email with occasional min A Baseline:  Goal status: INITIAL  LONG TERM GOALS: Target date: 05/05/24  Pt will read and comprehend 3-4 sentence paragraph with occasional min A Baseline:  Goal status: INITIAL  2.  Pt will independently scan reading material and self correct with occasional min A Baseline:  Goal status: INITIAL  3.  Pt will generate and correct 5 sentence document with occasional min A Baseline:  Goal status: INITIAL  4.  Pt will carryover 2 compensations for reading comprehension for emails and work (text to speech, spech to text) with mod I Baseline:  Goal status: INITIAL   ASSESSMENT:  CLINICAL IMPRESSION: Patient is a 63 y.o. male who was seen today for cognitive communication assessment. 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 Despite being able to manipulate large print words, Dakota Flores demonstrates impaired comprehension at word level. Unable to comprehend sentence levels despite compensations for visual impairment. Dakota Flores reports difficulty understanding texts and emails. He wrote simple to moderately complex sentences to dictation accurately. They both deny any other cognitive changes - Dakota Flores is completing house hold tasks, instructing Dakota Flores on financial management, managing the remote control, microwave with success. They both deny any word finding difficulties and report he passes his naming test in the hospital. I recommend skilled ST to maximize reading comprehension for independence, safety and QOL.    OBJECTIVE IMPAIRMENTS: include receptive language and reading comprehension. These impairments are limiting patient from return to work, managing finances, and effectively communicating at home and in community. Factors affecting potential to achieve goals and functional outcome are n/a.Dakota Flores Patient will benefit from skilled SLP services to address above impairments and improve overall  function.  REHAB POTENTIAL: Good  PLAN:  SLP FREQUENCY: 1-2x/week  SLP DURATION: 8 weeks  PLANNED INTERVENTIONS: Language facilitation, Environmental controls, Cueing hierachy, Cognitive reorganization, Internal/external aids, Functional tasks, Multimodal communication approach, SLP instruction and feedback, Compensatory strategies, Patient/family education, [phone] Treatment of speech (30 or 45 min) , and 07477- Speech Eval Sound Prod, Articulate, Phonological    Sherion Dooly, Leita Caldron, CCC-SLP 03/09/2024, 12:54 PM

## 2024-03-10 DIAGNOSIS — Z9889 Other specified postprocedural states: Secondary | ICD-10-CM | POA: Diagnosis not present

## 2024-03-10 DIAGNOSIS — R419 Unspecified symptoms and signs involving cognitive functions and awareness: Secondary | ICD-10-CM | POA: Diagnosis not present

## 2024-03-10 DIAGNOSIS — H5461 Unqualified visual loss, right eye, normal vision left eye: Secondary | ICD-10-CM | POA: Diagnosis not present

## 2024-03-10 DIAGNOSIS — I608 Other nontraumatic subarachnoid hemorrhage: Secondary | ICD-10-CM | POA: Diagnosis not present

## 2024-03-16 DIAGNOSIS — H53461 Homonymous bilateral field defects, right side: Secondary | ICD-10-CM | POA: Diagnosis not present

## 2024-03-16 DIAGNOSIS — H2513 Age-related nuclear cataract, bilateral: Secondary | ICD-10-CM | POA: Diagnosis not present

## 2024-03-16 DIAGNOSIS — H401231 Low-tension glaucoma, bilateral, mild stage: Secondary | ICD-10-CM | POA: Diagnosis not present

## 2024-03-17 ENCOUNTER — Ambulatory Visit: Admitting: Occupational Therapy

## 2024-03-17 ENCOUNTER — Encounter: Payer: Self-pay | Admitting: Occupational Therapy

## 2024-03-17 DIAGNOSIS — M542 Cervicalgia: Secondary | ICD-10-CM | POA: Diagnosis not present

## 2024-03-17 DIAGNOSIS — R41842 Visuospatial deficit: Secondary | ICD-10-CM

## 2024-03-17 DIAGNOSIS — I69118 Other symptoms and signs involving cognitive functions following nontraumatic intracerebral hemorrhage: Secondary | ICD-10-CM | POA: Diagnosis not present

## 2024-03-17 DIAGNOSIS — R2681 Unsteadiness on feet: Secondary | ICD-10-CM | POA: Diagnosis not present

## 2024-03-17 DIAGNOSIS — M6281 Muscle weakness (generalized): Secondary | ICD-10-CM | POA: Diagnosis not present

## 2024-03-17 DIAGNOSIS — R278 Other lack of coordination: Secondary | ICD-10-CM

## 2024-03-17 DIAGNOSIS — R480 Dyslexia and alexia: Secondary | ICD-10-CM | POA: Diagnosis not present

## 2024-03-17 DIAGNOSIS — R41841 Cognitive communication deficit: Secondary | ICD-10-CM | POA: Diagnosis not present

## 2024-03-17 NOTE — Patient Instructions (Addendum)
 VISUAL SCANNING STRATEGIES  1. Look for the edge of objects (to the left and/or right) so that you make sure you are seeing all of an object 2. Turn your head when walking, scan from side to side, particularly in busy environments 3. Use an organized scanning pattern. It's usually easier to scan from top to bottom, and left to right (like you are reading) 4. Double check yourself 5. Use a line guide (like a blank piece of paper) or your finger when reading 6. If necessary, place brightly colored tape at end of table or work area as a reminder to always look until you see the tape.     Activities to try at home to encourage visual scanning:   1. Word searches 2. Mazes 3. Puzzles 4. Card games 5. Computer games and/or searches 6. Connect-the-dots  Activities for environmental (larger) scanning:  1. With supervision, scan for items in grocery store or drugstore.  Begin with a familiar store, then progress to a new store you've never been in before. Make sure you have supervision with this.  2. With supervision, tell a family member or caregiver when it is safe to cross a street after looking all directions and any side streets. However, do NOT cross street unless family member or caregiver is with you and says it is OK     Coordination Activities  Perform the following activities for 10 minutes 1-2 times per day with right hand(s).  Rotate ball in fingertips (clockwise and counter-clockwise). Deal cards with your thumb (Hold deck in hand and push card off top with thumb). Rotate one card in hand (clockwise and counter-clockwise). Pick up coins one at a time until you get 5 in your hand, then move coins from palm to fingertips to stack one at a time. Practice writing and/or typing.

## 2024-03-17 NOTE — Therapy (Signed)
 OUTPATIENT OCCUPATIONAL THERAPY NEURO TREATMENT   Patient Name: Dakota Flores MRN: [id number] DOB:August 19, 1961, 63 y.o., male Today's Date: 03/17/2024  PCP: Cristopher Bottcher, NP REFERRING PROVIDER: Valora Tawni BIRCH, PA-C  END OF SESSION:  OT End of Session - 03/17/24 0805     Visit Number 2    Number of Visits 13    Date for OT Re-Evaluation 04/24/24    Authorization Type BC/BS - 25 VL combined (used 4), no auth required    Authorization - Number of Visits 21    OT Start Time 0800    OT Stop Time 0845    OT Time Calculation (min) 45 min    Activity Tolerance Patient tolerated treatment well    Behavior During Therapy [facility] for tasks assessed/performed          Past Medical History:  Diagnosis Date   Depression    Past Surgical History:  Procedure Laterality Date   COLONOSCOPY  08/2017   LASIK  2005   VASECTOMY     WISDOM TOOTH EXTRACTION     There are no active problems to display for this patient.   ONSET DATE: 03/03/2024 (referral date)   REFERRING DIAG: I67.1 (ICD-10-CM) - Cerebral aneurysm, nonruptured  THERAPY DIAG:  Visuospatial deficit  Other lack of coordination  Muscle weakness (generalized)  Rationale for Evaluation and Treatment: Rehabilitation  SUBJECTIVE:   SUBJECTIVE STATEMENT: No pain today, but I have had some tightness Lt side of neck - pt with decrease head turn to Lt side Pt accompanied by: self and significant other ([name])  PERTINENT HISTORY:  63 y.o. 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 No  FALLS: Has patient fallen in last 6 months? No  LIVING ENVIRONMENT: Lives with: lives with their spouse Lives in: 1 level house, 3 steps to enter from front Has following equipment at home: None  PLOF: Independent, Leisure: tennis, home improvement, and works as Pensions consultant for [employer]  PATIENT GOALS: return to normal vision and reading   OBJECTIVE:  Note: Objective measures were completed at Evaluation unless otherwise noted.  HAND DOMINANCE: Right  ADLs: Overall ADLs: independent Transfers/ambulation related to ADLs: independent Eating: independent Grooming: mod I  UB Dressing: independent LB Dressing: independent Toileting: independent Bathing: mod I  Tub Shower transfers: mod I  Equipment: none  IADLs: Shopping: has not yet done Light housekeeping: pt has returned to light cleaning Meal Prep: wife has always done Community mobility: currently unable to drive Medication management: independent Financial management: not yet attempted since d/c from hospital  Handwriting: no changes  MOBILITY STATUS: Independent   UPPER EXTREMITY ROM:  BUE AROM WNLs    UPPER EXTREMITY MMT:   BUE MMT grossly 5/5   HAND FUNCTION: Grip strength: Right: 103.1 lbs; Left: 105.3 lbs  COORDINATION: 9 Hole Peg test: Right: 36.48 sec initially (pt kept pulling pegs back out after placing d/t ? Sweaty fingers), 2nd trial = 28.68 sec; Left: 25.04 sec  SENSATION: WFL  EDEMA: very mild Rt hand  COGNITION: Overall cognitive status: pt with max  difficulty spelling world backwards/decreased processing and significantly extra time, self corrected error. Pt also demo some mild difficulty following directions, but could also be due to [facility]. 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 Recommended pt do gentle stretches with head turns and head tilts bilaterally - pt reports he has some ex's from P[employer]T.   Pt issued visual scanning strategies and activities/ways to encourage visual scanning at home - see pt instructions for details. Reviewed extensively.  Also recommend easy learn to read books for ease of visual processing and to increase more automatic word recognition.   Pt also issued high level coordination HEP for Rt hand - see pt instructions for details.   Pt reports he is back to playing tennis - the ball would sometimes go into a blind spot in Rt visual field but he could still hit the ball based on motor memory and anticipating where the ball would be.      PATIENT EDUCATION: Education details: SEE ABOVE Person educated: Patient and Spouse Education method: Explanation Education comprehension: verbalized understanding  HOME EXERCISE PROGRAM: 03/17/24: High level coordination HEP, visual scanning strategies and activities   GOALS: Goals reviewed with patient? Yes  SHORT TERM GOALS: Target date:  04/03/24  Pt to verbalize understanding with visual scanning strategies and activities to encourage visual scanning Baseline: Goal status: IN PROGRESS  2.  Pt independent with high level coordination HEP  Baseline:  Goal status: IN PROGRESS  3.  Pt to perform tabletop scanning at 90% accuracy Baseline:  Goal status: INITIAL   LONG TERM GOALS: Target date: 04/24/24  Pt to perform environmental scanning at 85% accuracy or higher with simple physical task Baseline:  Goal status: INITIAL  2.  Pt to sufficiently type for work related tasks including entering/translating correct info on the computer Baseline:  Goal status: INITIAL  3.  Pt to demo ability to perform problem solving task for work simulation w/ task modifications/external cues prn with  no verbal cues Baseline:  Goal status: INITIAL  4.  Improve coordination as evidenced by performing 9 hole peg test in 25 sec or less Baseline: b/t 28 and 36 sec.  Goal status: INITIAL   ASSESSMENT:  CLINICAL IMPRESSION: Patient seen today for occupational therapy treatment for cerebral aneurysm. Hx includes seizures, HOH. Pt/wife with greater understanding of visual scanning strategies and activities to try that will help with visual scanning and visual processing. Patient currently presents below baseline level of functioning demonstrating functional deficits and impairments as noted below. Pt would benefit from skilled OT services in the outpatient setting to work on impairments as noted below to help pt return to PLOF as able.   SABRA   PERFORMANCE DEFICITS: in functional skills including IADLs, coordination, Fine motor control, decreased knowledge of use of DME, and vision, cognitive skills including memory, perception, problem solving, and safety awareness.   IMPAIRMENTS: are limiting patient from IADLs, work, and leisure.   CO-MORBIDITIES: may have co-morbidities  that affects occupational performance. Patient will benefit from  skilled OT to address above impairments and improve overall function.  MODIFICATION OR ASSISTANCE TO COMPLETE EVALUATION: No modification of tasks or assist necessary to complete an evaluation.  OT OCCUPATIONAL PROFILE AND HISTORY: Detailed assessment: Review of records and additional review of physical, cognitive, psychosocial history related to current functional performance.  CLINICAL DECISION MAKING: Moderate - several treatment options, min-mod task modification necessary  REHAB POTENTIAL: Good  EVALUATION COMPLEXITY: Moderate    PLAN:  OT FREQUENCY: 2x/week  OT DURATION: 6 weeks  PLANNED INTERVENTIONS: 97535 self care/ADL training, 02889 therapeutic exercise, 97530 therapeutic activity, 97112 neuromuscular re-education, 97129 Cognitive training (first 15 min), 02869 Cognitive training(each additional 15 min), visual/perceptual remediation/compensation, patient/family education, and DME and/or AE instructions  RECOMMENDED OTHER SERVICES: Speech therapy services  CONSULTED AND AGREED WITH PLAN OF CARE: Patient and family member/caregiver  PLAN FOR NEXT SESSION: color suduko game, golf solitaire, symbol digits worksheet, transposing written things from book/magazine to computer, 48 pc puzzle, issue pencil line guide prn   Burnard JINNY Roads, OT 03/17/2024, 8:07 AM

## 2024-03-18 ENCOUNTER — Encounter: Payer: Self-pay | Admitting: Speech Pathology

## 2024-03-18 ENCOUNTER — Ambulatory Visit: Admitting: Speech Pathology

## 2024-03-18 DIAGNOSIS — R41841 Cognitive communication deficit: Secondary | ICD-10-CM

## 2024-03-18 DIAGNOSIS — M542 Cervicalgia: Secondary | ICD-10-CM | POA: Diagnosis not present

## 2024-03-18 DIAGNOSIS — R2681 Unsteadiness on feet: Secondary | ICD-10-CM | POA: Diagnosis not present

## 2024-03-18 DIAGNOSIS — R41842 Visuospatial deficit: Secondary | ICD-10-CM | POA: Diagnosis not present

## 2024-03-18 DIAGNOSIS — R480 Dyslexia and alexia: Secondary | ICD-10-CM

## 2024-03-18 DIAGNOSIS — M6281 Muscle weakness (generalized): Secondary | ICD-10-CM | POA: Diagnosis not present

## 2024-03-18 DIAGNOSIS — I69118 Other symptoms and signs involving cognitive functions following nontraumatic intracerebral hemorrhage: Secondary | ICD-10-CM | POA: Diagnosis not present

## 2024-03-18 DIAGNOSIS — R278 Other lack of coordination: Secondary | ICD-10-CM | POA: Diagnosis not present

## 2024-03-18 NOTE — Patient Instructions (Addendum)
   Acquired Alexia from a stroke  In your homework, it is not really about getting all of the answers correct - it is about practicing reading so read all of the choices even though   Try to work on reading 3x a day 20 minutes at a time  Try to have a set time/routine to work on your OT and PT

## 2024-03-18 NOTE — Therapy (Signed)
 OUTPATIENT SPEECH LANGUAGE PATHOLOGY TREATMENT   Patient Name: Dakota Flores MRN: [id number] DOB:03/02/1961, 62 y.o., male Today's Date: 03/18/2024  PCP: Cristopher Bottcher, NP REFERRING PROVIDER: Cristopher Bottcher, NP  END OF SESSION:  End of Session - 03/18/24 1106     Visit Number 2    Number of Visits 9    Date for SLP Re-Evaluation 05/04/24    Authorization Type 25 total for all disciplines    Authorization - Number of Visits 25    SLP Start Time 0930    SLP Stop Time  1015    SLP Time Calculation (min) 45 min    Activity Tolerance Patient tolerated treatment well          Past Medical History:  Diagnosis Date   Depression    Past Surgical History:  Procedure Laterality Date   COLONOSCOPY  08/2017   LASIK  2005   VASECTOMY     WISDOM TOOTH EXTRACTION     There are no active problems to display for this patient.   ONSET DATE: 02/16/24   REFERRING DIAG: R41.89 (ICD-10-CM) - Other symptoms and signs involving cognitive functions and awareness  THERAPY DIAG:  Cognitive communication deficit  Dyslexia and alexia  Rationale for Evaluation and Treatment: Rehabilitation  SUBJECTIVE:   SUBJECTIVE STATEMENT: He is good mentally, it's the reading processing Pt accompanied by: significant other, [name]  PERTINENT HISTORY: 63 y.o. 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 No  FALLS: Has patient fallen in last 6 months?  See PT evaluation for details  LIVING ENVIRONMENT: Lives with: lives with their spouse Lives in: House/apartment  PLOF:  Level of assistance: Independent with ADLs, Independent with IADLs Employment:  Data processing manager for [employer]  PATIENT GOALS: To return to work if able  OBJECTIVE:  Note: Objective measures were completed at Evaluation unless otherwise noted.   COGNITION: Overall cognitive status: Within functional limits for tasks assessed Areas of impairment:   Functional deficits: reading comprehension/processing  COGNITIVE COMMUNICATION: Following directions: Follows multi-step commands consistently  Auditory comprehension: WFL Verbal expression: WFL Functional communication: Impaired: unable to read/comprehend texts and e mails  ORAL MOTOR EXAMINATION: Overall status: WFL Comments:   STANDARDIZED ASSESSMENTS: Subtests of RCBA: Word-Visual - 8/10 - extended time consistently Word-Semantic 8/10 - extended time consistently Sentence-Picture 0/3 - not completed due to extended time   PATIENT REPORTED OUTCOME MEASURES (PROM): Cognitive Function: 133/140 - all items a 5 or no difficulty except a 2 (a lot of difficulty_) reading complex instructions and a 1,very often difficulty reading something several times to understand it  TREATMENT DATE:    03/18/24: Assessed comprehension of numbers - 10/10 accurate. Targeted reading a word level identifying synonyms out of field of 3 with extended time and usual min verbal cues to ID and correct errors. Scanning page with extended time and mod I. Targeted reading comprehension at word level, identifying and spelling words with fill in the blanks with usual min verbal cues. Spelling with mod I. He denies eye fatigue or strain  03/10/23: Eval only due to time constraints    PATIENT EDUCATION: Education details: areas of impairment, goals Person educated: Patient and Spouse Education method: Explanation and Verbal cues Education comprehension: needs further education   GOALS: Goals reviewed with  patient? Yes  SHORT TERM GOALS: Target date: 04/20/24  Pt will scan left to right to see 3-4 words on page with rare min A Baseline: Goal status: ONGOING  2.  Pt will ID word f:4 with extended time 4/5x  Baseline:  Goal status: ONGOING  3.  Pt will read and comprehend 5 -7 word text with rare min A and extended time Baseline:  Goal status: ONGOING  4.  Pt will generate and edit 3 sentence email with occasional min A Baseline:  Goal status: ONGOING  LONG TERM GOALS: Target date: 05/05/24  Pt will read and comprehend 3-4 sentence paragraph with occasional min A Baseline:  Goal status: ONGOING  2.  Pt will independently scan reading material and self correct with occasional min A Baseline:  Goal status: ONGOING  3.  Pt will generate and correct 5 sentence document with occasional min A Baseline:  Goal status: ONGOING  4.  Pt will carryover 2 compensations for reading comprehension for emails and work (text to speech, spech to text) with mod I Baseline:  Goal status: ONGOING   ASSESSMENT:  CLINICAL IMPRESSION: Patient is a 63 y.o. male who was seen today for cognitive communication assessment. 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 Despite being able to manipulate large print words, [name] demonstrates impaired comprehension at word level. Unable to comprehend sentence levels despite compensations for visual impairment. [name] reports difficulty understanding texts and emails. He wrote simple to moderately complex sentences to dictation accurately. They both deny any other cognitive changes - [name] is completing house hold tasks, instructing [name] on financial management, managing the remote control, microwave with success. They both deny any word finding difficulties and report he passes his naming test in the hospital. I recommend skilled ST to maximize reading comprehension for independence, safety and QOL.    OBJECTIVE IMPAIRMENTS: include  receptive language and reading comprehension. These impairments are limiting patient from return to work, managing finances, and effectively communicating at home and in community. Factors affecting potential to achieve goals and functional outcome are n/a.SABRA Patient will benefit from skilled SLP services to address above impairments and improve overall function.  REHAB POTENTIAL: Good  PLAN:  SLP FREQUENCY: 1-2x/week  SLP DURATION: 8 weeks  PLANNED INTERVENTIONS: Language facilitation, Environmental controls, Cueing hierachy, Cognitive reorganization, Internal/external aids, Functional tasks, Multimodal communication approach, SLP instruction and feedback, Compensatory strategies, Patient/family education, [phone] Treatment of speech (30 or 45 min) , and [id number]- Speech Eval Sound Prod, Articulate, Phonological    Shantanique Hodo, Leita Caldron, CCC-SLP 03/18/2024, 11:07 AM

## 2024-03-19 ENCOUNTER — Ambulatory Visit: Admitting: Physical Therapy

## 2024-03-19 ENCOUNTER — Ambulatory Visit: Admitting: Occupational Therapy

## 2024-03-19 DIAGNOSIS — R2681 Unsteadiness on feet: Secondary | ICD-10-CM

## 2024-03-19 DIAGNOSIS — R41841 Cognitive communication deficit: Secondary | ICD-10-CM | POA: Diagnosis not present

## 2024-03-19 DIAGNOSIS — R480 Dyslexia and alexia: Secondary | ICD-10-CM | POA: Diagnosis not present

## 2024-03-19 DIAGNOSIS — M542 Cervicalgia: Secondary | ICD-10-CM | POA: Diagnosis not present

## 2024-03-19 DIAGNOSIS — M6281 Muscle weakness (generalized): Secondary | ICD-10-CM

## 2024-03-19 DIAGNOSIS — I69118 Other symptoms and signs involving cognitive functions following nontraumatic intracerebral hemorrhage: Secondary | ICD-10-CM | POA: Diagnosis not present

## 2024-03-19 DIAGNOSIS — R278 Other lack of coordination: Secondary | ICD-10-CM

## 2024-03-19 DIAGNOSIS — R41842 Visuospatial deficit: Secondary | ICD-10-CM | POA: Diagnosis not present

## 2024-03-19 NOTE — Therapy (Signed)
 OUTPATIENT PHYSICAL THERAPY NEURO TREATMENT - DISCHARGE NOTE   Patient Name: Dakota Flores MRN: [id number] DOB:[birth year], 62 y.o., male Today's Date: 03/19/2024   PCP: Dakota Bottcher, NP REFERRING PROVIDER: Valora Tawni BIRCH, PA-C  PHYSICAL THERAPY DISCHARGE SUMMARY  Visits from Start of Care: 3  Current functional level related to goals / functional outcomes: Independent   Remaining deficits: Mildly decreased cervical ROM, mild neck stiffness, R homonymous hemianopsia   Education / Equipment: Handout for final HEP   Patient agrees to discharge. Patient goals were met. Patient is being discharged due to meeting the stated rehab goals.    END OF SESSION:  PT End of Session - 03/19/24 0848     Visit Number 3    Number of Visits 5    Date for PT Re-Evaluation 04/16/24    Authorization Type [employer]    Authorization Time Period 25 VL combined (4 used prior to eval)    PT Start Time 0846    PT Stop Time 0924    PT Time Calculation (min) 38 min    Equipment Utilized During Treatment Gait belt    Activity Tolerance Patient tolerated treatment well    Behavior During Therapy WFL for tasks assessed/performed            Past Medical History:  Diagnosis Date   Depression    Past Surgical History:  Procedure Laterality Date   COLONOSCOPY  08/2017   LASIK  2005   VASECTOMY     WISDOM TOOTH EXTRACTION     There are no active problems to display for this patient.   ONSET DATE: 03/03/2024 (referral date)  REFERRING DIAG: I67.1 (ICD-10-CM) - Cerebral aneurysm, nonruptured  THERAPY DIAG:  Muscle weakness (generalized)  Other lack of coordination  Cervicalgia  Unsteadiness on feet  Rationale for Evaluation and Treatment: Rehabilitation  SUBJECTIVE:                                                                                                                                                                                             SUBJECTIVE  STATEMENT:  Dakota Flores  Pt denies any acute changes since last visit. Pt reports that his pain is doing much better, just has some neck stiffness. Pt used a Biochemist, clinical to relieve trigger points in his upper trap and neck muscles.   Hard to tell if vision has changed at all, has homework from OT and from Speech Therapy to work on.  Pt accompanied by: self and significant other wife Dakota Flores  PERTINENT HISTORY: Dakota Flores is a 63 y.o. 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   PAIN:  Are you having pain? No  PRECAUTIONS: Other: 10# lifting restriction, avoid exertion/strenuous exercise, NO DRIVING (R homonymous hemianopsia), seizures  RED FLAGS: None   WEIGHT BEARING RESTRICTIONS: No  FALLS: Has patient fallen in last 6 months? No  LIVING ENVIRONMENT: Lives with: lives with their spouse Lives in: House/apartment Stairs: 2-3 STE, no issues with stairs; can access all needs on main level of home Has following equipment at home: None  PLOF: Independent  PATIENT GOALS: 2 things: improve posture and improve range of my neck and shoulders  OBJECTIVE:  Note: Objective measures were completed at Evaluation unless otherwise noted.  DIAGNOSTIC FINDINGS: See Chart (from outside hospital)  COGNITION: Overall cognitive status: Impaired, impaired comprehension   SENSATION: Pt reports no N/T in either UE or LE, does occasionally feel that sensation is different in his R side as compared to his L side Proprioception appears mildly impaired during functional testing  COORDINATION: WFL  POSTURE: forward head and increased thoracic kyphosis  CERVICAL ROM:   Active ROM A/PROM (deg) eval  Flexion 30  Extension 27  Right  lateral flexion 12  Left lateral flexion 20  Right rotation 40  Left rotation 45   (Blank rows = not tested)   LOWER EXTREMITY ROM:     Active  Right Eval Left Eval  Hip flexion    Hip extension    Hip abduction    Hip adduction    Hip internal rotation    Hip external rotation    Knee flexion    Knee extension Tight HS Tight HS  Ankle dorsiflexion    Ankle plantarflexion    Ankle inversion    Ankle eversion     (Blank rows = not tested)  LOWER EXTREMITY MMT:    MMT Right Eval Left Eval  Hip flexion 4 4  Hip extension    Hip abduction    Hip adduction    Hip internal rotation    Hip external rotation    Knee flexion 4 4  Knee extension 4 4  Ankle dorsiflexion 4 4  Ankle plantarflexion    Ankle inversion    Ankle eversion    (Blank rows = not tested)  BED MOBILITY:  Mod I per patient report, ok to lay flat, sidesleeper  TRANSFERS: Sit to stand: Modified independence  Assistive device utilized: None     Stand to sit: Modified independence  Assistive device utilized: None     Chair to chair: Modified independence  Assistive device utilized: None       RAMP:  Not tested  CURB:  Not tested  STAIRS:  STAIRS:  Level of Assistance: Modified independence  Stair Negotiation Technique: Alternating Pattern  with No Rails  Number of Stairs: 4   Height of Stairs: 6  Comments: WFL   GAIT: Findings:  Gait pattern: WFL Distance walked: various clinic distances Assistive device utilized: None Level of assistance: Modified independence Comments: WFL, slightly decreased speed   FUNCTIONAL TESTS:  TREATMENT DATE:    TherEx To address muscle tightness and weakness in cervical and upper shoulder muscles: At the wall: Banded wall slides with red TB Standing resisted scap squeezes with green TB  Added appropriate exercises to  HEP, see bolded below  TherAct For goal assessment: CERVICAL ROM:   Active ROM A/PROM (deg) eval AROM (Deg) 03/19/24  Flexion 30 40  Extension 27 30  Right lateral flexion 12 27  Left lateral flexion 20 25  Right rotation 40 45  Left rotation 45 50   (Blank rows = not tested)     PATIENT EDUCATION: Education details: continue HEP and added to HEP, results of cervical AROM assessment as compared to initial assessment, plan to d/c from PT Person educated: Patient and Spouse Education method: Explanation, Demonstration, and Handouts Education comprehension: verbalized understanding and returned demonstration  HOME EXERCISE PROGRAM: Access Code: [email or web address] URL: [email or web address] Date: 03/05/2024 Prepared by: Waddell Southgate  Exercises - Seated Upper Trapezius Stretch  - 1 x daily - 7 x weekly - 1 sets - 3-5 reps - 30 sec hold - Gentle Levator Scapulae Stretch  - 1 x daily - 7 x weekly - 1 sets - 3-5 reps - 30 sec hold - Supine Chin Tuck with Towel  - 1 x daily - 7 x weekly - 3 sets - 10 reps - 5 sec hold - Standing Shoulder Abduction Slides at Wall  - 1 x daily - 7 x weekly - 3 sets - 10 reps - Standing shoulder flexion wall slides  - 1 x daily - 7 x weekly - 3 sets - 10 reps - Prone W Scapular Retraction  - 1 x daily - 7 x weekly - 3 sets - 10 reps - 5 sec hold - Prone Scapular Retraction Y  - 1 x daily - 7 x weekly - 3 sets - 10 reps - 5 sec hold - Shoulder Flexion Wall Slide with Resistance Band  - 1 x daily - 7 x weekly - 3 sets - 10 reps - Scapular Retraction with Resistance  - 1 x daily - 7 x weekly - 3 sets - 10 reps  GOALS: Goals reviewed with patient? Yes  SHORT TERM GOALS=LONG TERM GOALS due to length of POC   LONG TERM GOALS: Target date: 04/02/2024  Pt will be independent with final HEP for improved cervical ROM, improved posture and management of pain symptoms in order to build upon functional gains made in therapy.  Baseline:  Goal status:  MET  2.  Pt will increase his cervical AROM by >/= 5 degrees in limited motions to demonstrate improved function. Baseline:  CERVICAL ROM:   Active ROM A/PROM (deg) eval AROM (Deg) 03/19/24  Flexion 30 40  Extension 27 30  Right lateral flexion 12 27  Left lateral flexion 20 25  Right rotation 40 45  Left rotation 45 50   (Blank rows = not tested)    Goal status: MET  3.  Pt will demonstrate compensatory strategies for his R homonymous hemianopsia in order to improve his safety and independence with functional mobility. Baseline:  Goal status: MET    ASSESSMENT:  CLINICAL IMPRESSION: Emphasis of skilled PT session on assessing LTG with plan to d/c from OPPT services this date as well as adding periscapular strengthening exercises to HEP. Pt has met 3/3 LTG due to being independent with his final HEP, improving his cervical AROM by 5 degrees in most planes of motion, and demonstrating compensatory strategies for  his R HH that he continues to work on addressing with OT. He has made great progress and is agreeable to d/c from OPPT services this date and continue independently with his HEP at home.    OBJECTIVE IMPAIRMENTS: decreased cognition, decreased knowledge of condition, decreased ROM, decreased strength, impaired vision/preception, improper body mechanics, postural dysfunction, and pain.   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 are also affecting patient's functional outcome.   REHAB POTENTIAL: Good  CLINICAL DECISION MAKING: Evolving/moderate complexity  EVALUATION COMPLEXITY: Moderate  PLAN: discharge from PT   Waddell Southgate, PT Waddell Southgate, PT, DPT,  CSRS  03/19/2024, 9:25 AM

## 2024-03-24 ENCOUNTER — Ambulatory Visit: Admitting: Speech Pathology

## 2024-03-24 ENCOUNTER — Encounter: Admitting: Occupational Therapy

## 2024-03-24 ENCOUNTER — Ambulatory Visit: Admitting: Occupational Therapy

## 2024-03-24 DIAGNOSIS — R278 Other lack of coordination: Secondary | ICD-10-CM | POA: Diagnosis not present

## 2024-03-24 DIAGNOSIS — R480 Dyslexia and alexia: Secondary | ICD-10-CM | POA: Diagnosis not present

## 2024-03-24 DIAGNOSIS — R41841 Cognitive communication deficit: Secondary | ICD-10-CM

## 2024-03-24 DIAGNOSIS — I69118 Other symptoms and signs involving cognitive functions following nontraumatic intracerebral hemorrhage: Secondary | ICD-10-CM

## 2024-03-24 DIAGNOSIS — R41842 Visuospatial deficit: Secondary | ICD-10-CM

## 2024-03-24 DIAGNOSIS — M6281 Muscle weakness (generalized): Secondary | ICD-10-CM | POA: Diagnosis not present

## 2024-03-24 DIAGNOSIS — R2681 Unsteadiness on feet: Secondary | ICD-10-CM | POA: Diagnosis not present

## 2024-03-24 DIAGNOSIS — M542 Cervicalgia: Secondary | ICD-10-CM | POA: Diagnosis not present

## 2024-03-24 NOTE — Therapy (Signed)
 OUTPATIENT SPEECH LANGUAGE PATHOLOGY TREATMENT   Patient Name: Dakota Flores MRN: [id number] DOB:1961/03/04, 63 y.o., male Today's Date: 03/24/2024  PCP: Dakota Bottcher, NP REFERRING PROVIDER: Cristopher Bottcher, NP  END OF SESSION:  End of Session - 03/24/24 0807     Visit Number 3    Number of Visits 9    Date for SLP Re-Evaluation 05/04/24    Authorization Type 25 total for all disciplines    Authorization - Number of Visits 25    SLP Start Time 0807    SLP Stop Time  0845    SLP Time Calculation (min) 38 min    Activity Tolerance Patient tolerated treatment well           Past Medical History:  Diagnosis Date   Depression    Past Surgical History:  Procedure Laterality Date   COLONOSCOPY  08/2017   LASIK  2005   VASECTOMY     WISDOM TOOTH EXTRACTION     There are no active problems to display for this patient.   ONSET DATE: 02/16/24   REFERRING DIAG: R41.89 (ICD-10-CM) - Other symptoms and signs involving cognitive functions and awareness  THERAPY DIAG:  Dyslexia and alexia  Cognitive communication deficit  Rationale for Evaluation and Treatment: Rehabilitation  SUBJECTIVE:   SUBJECTIVE STATEMENT: He is good mentally, it's the reading processing Pt accompanied by: significant other, Dakota Flores  PERTINENT HISTORY: 63 y.o. 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 No  FALLS: Has patient fallen in last 6 months?  See PT evaluation for details  LIVING ENVIRONMENT: Lives with: lives with their spouse Lives in: House/apartment  PLOF:  Level of assistance: Independent with ADLs, Independent with IADLs Employment:  Data processing manager for [employer]  PATIENT GOALS: To return to work if able  OBJECTIVE:  Note: Objective measures were completed at Evaluation unless otherwise noted.   COGNITION: Overall cognitive status: Within functional limits for tasks assessed Areas of impairment:   Functional deficits: reading comprehension/processing  COGNITIVE COMMUNICATION: Following directions: Follows multi-step commands consistently  Auditory comprehension: WFL Verbal expression: WFL Functional communication: Impaired: unable to read/comprehend texts and e mails  ORAL MOTOR EXAMINATION: Overall status: WFL Comments:   STANDARDIZED ASSESSMENTS: Subtests of RCBA: Word-Visual - 8/10 - extended time consistently Word-Semantic 8/10 - extended time consistently Sentence-Picture 0/3 - not completed due to extended time   PATIENT REPORTED OUTCOME MEASURES (PROM): Cognitive Function: 133/140 - all items a 5 or no difficulty except a 2 (a lot of difficulty_) reading complex instructions and a 1,very often difficulty reading something several times to understand it  TREATMENT DATE:   03/24/24: SLP assisted pt in modifying accessibility features on phone. Pt able to demonstrate with min-A verbal cues reading of text messages. Discussed variety of HEP activities to support return to reading. Addressed reading comprehension of mod complex words with atypical spellings with pt achieving 100% accuracy given extra time and occasional min-A. Pt generates antonym in 100% of opportunities demonstrating intact written skills. Demonstrated strategies for decoding including syllabification, written support d/t intact written expression.   03/18/24: Assessed comprehension of numbers - 10/10 accurate. Targeted reading a word level identifying synonyms out of field of 3 with extended time and usual min  verbal cues to ID and correct errors. Scanning page with extended time and mod I. Targeted reading comprehension at word level, identifying and spelling words with fill in the blanks with usual min verbal cues. Spelling with mod I. He denies eye fatigue or strain  03/10/23: Eval only due to time constraints    PATIENT EDUCATION: Education details: areas of impairment, goals Person educated: Patient and Spouse Education method: Explanation and Verbal cues Education comprehension: needs further education   GOALS: Goals reviewed with patient? Yes  SHORT TERM GOALS: Target date: 04/20/24  Pt will scan left to right to see 3-4 words on page with rare min A Baseline: Goal status: ONGOING  2.  Pt will ID word f:4 with extended time 4/5x  Baseline:  Goal status: ONGOING  3.  Pt will read and comprehend 5 -7 word text with rare min A and extended time Baseline:  Goal status: ONGOING  4.  Pt will generate and edit 3 sentence email with occasional min A Baseline:  Goal status: ONGOING  LONG TERM GOALS: Target date: 05/05/24  Pt will read and comprehend 3-4 sentence paragraph with occasional min A Baseline:  Goal status: ONGOING  2.  Pt will independently scan reading material and self correct with occasional min A Baseline:  Goal status: ONGOING  3.  Pt will generate and correct 5 sentence document with occasional min A Baseline:  Goal status: ONGOING  4.  Pt will carryover 2 compensations for reading comprehension for emails and work (text to speech, spech to text) with mod I Baseline:  Goal status: ONGOING   ASSESSMENT:  CLINICAL IMPRESSION: Patient is a 63 y.o. male who was seen today for cognitive communication assessment. 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 Despite being able to manipulate large print words, Dakota Flores demonstrates impaired comprehension at word level. Unable to comprehend sentence levels despite compensations for visual  impairment. Dakota Flores reports difficulty understanding texts and emails. He wrote simple to moderately complex sentences to dictation accurately. They both deny any other cognitive changes - Dakota Flores is completing house hold tasks, instructing Dakota Flores on financial management, managing the remote control, microwave with success. They both deny any word finding difficulties and report he passes his naming test in the hospital. I recommend skilled ST to maximize reading comprehension for independence, safety and QOL.    OBJECTIVE IMPAIRMENTS: include receptive language and reading comprehension. These impairments are limiting patient from return to work, managing finances, and effectively communicating at home and in community. Factors affecting potential to achieve goals and functional outcome are n/a.SABRA Patient will benefit from skilled SLP services to address above impairments and improve overall function.  REHAB POTENTIAL: Good  PLAN:  SLP FREQUENCY: 1-2x/week  SLP DURATION: 8 weeks  PLANNED INTERVENTIONS: Language facilitation, Environmental controls, Cueing hierachy, Cognitive reorganization, Internal/external aids, Functional tasks, Multimodal communication approach,  SLP instruction and feedback, Compensatory strategies, Patient/family education, [phone] Treatment of speech (30 or 45 min) , and [id number]- Speech Eval Sound Prod, Articulate, Phonological    Harlene LITTIE Ned, CCC-SLP 03/24/2024, 8:08 AM

## 2024-03-24 NOTE — Therapy (Signed)
 OUTPATIENT OCCUPATIONAL THERAPY NEURO TREATMENT   Patient Name: Dakota Flores MRN: [id number] DOB:1961/02/10, 63 y.o., male Today's Date: 03/24/2024  PCP: Dakota Bottcher, NP REFERRING PROVIDER: Valora Tawni BIRCH, PA-C  END OF SESSION:  OT End of Session - 03/24/24 1739     Visit Number 3    Number of Visits 13    Date for OT Re-Evaluation 04/24/24    Authorization Type BC/BS - 25 VL combined (used 4), no auth required    Authorization - Number of Visits 21    OT Start Time 1019    OT Stop Time 1100    OT Time Calculation (min) 41 min    Activity Tolerance Patient tolerated treatment well    Behavior During Therapy WFL for tasks assessed/performed         Past Medical History:  Diagnosis Date   Depression    Past Surgical History:  Procedure Laterality Date   COLONOSCOPY  08/2017   LASIK  2005   VASECTOMY     WISDOM TOOTH EXTRACTION     There are no active problems to display for this patient.  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 It has been a struggle for him to reduce his neck and shoulder pain. He used topical cream with some relief but wakes up in pain. He takes a brain nap in the afternoon.   Pt accompanied by: self and significant other (Dakota Flores)  PERTINENT HISTORY:  63 y.o. 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 Yes: NPRS scale: 5/10 Pain location: L shoulder and neck Pain description: tightness Aggravating factors: increases over night Relieving factors: stretches  FALLS: Has patient fallen in last 6 months? No  LIVING ENVIRONMENT: Lives with: lives with their spouse Lives in: 1 level house, 3 steps to enter from front Has following equipment at home: None  PLOF: Independent, Leisure: tennis, home improvement, and works as Pensions consultant for [employer]  PATIENT GOALS: return to normal vision and reading   OBJECTIVE:  Note: Objective measures were completed at Evaluation unless otherwise noted.  HAND DOMINANCE: Right  ADLs: Overall ADLs: independent Transfers/ambulation related to ADLs: independent Eating: independent Grooming: mod I  UB Dressing: independent LB Dressing: independent Toileting: independent Bathing: mod I  Tub Shower transfers: mod I  Equipment: none  IADLs: Shopping: has not yet done Light housekeeping: pt has returned to light cleaning Meal Prep: wife has always done Community mobility: currently unable to drive Medication management: independent Financial management: not yet attempted since d/c from hospital  Handwriting: no changes  MOBILITY STATUS: Independent   UPPER EXTREMITY ROM:  BUE AROM WNLs  UPPER EXTREMITY MMT:   BUE MMT grossly 5/5  HAND FUNCTION: Grip strength: Right: 103.1  lbs; Left: 105.3 lbs  COORDINATION: 9 Hole Peg test: Right: 36.48 sec initially (pt kept pulling pegs back out after placing d/t ? Sweaty fingers), 2nd trial = 28.68 sec; Left: 25.04 sec  SENSATION: WFL  EDEMA: very mild Rt hand  COGNITION: Overall cognitive status: pt with max difficulty spelling  world backwards/decreased processing and significantly extra time, self corrected error. Pt also demo some mild difficulty following directions, but could also be due to [facility]. 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 Reviewed visual scanning activities and strategies.   OT encouraged pt to apply heat for 10 - 15 min prior to stretches for improved pain and ROM.   OT educated pt on table top play of Golf Solitaire for LUE to address fine motor coordination, gross motor coordination, upper extremity range of motion, pain reduction, scanning and locating of items, processing, and bimanual coordination/trunk control. Pt required minimal cues for proper play.   PATIENT EDUCATION: Education details: SEE ABOVE Person educated: Patient and Spouse Education method: Explanation, Demonstration, and Verbal cues Education comprehension: verbalized understanding, returned demonstration, and verbal cues required  HOME EXERCISE PROGRAM: 03/17/24:  High level coordination HEP, visual scanning strategies and activities  GOALS: Goals reviewed with patient? Yes  SHORT TERM GOALS: Target date: 04/03/24  Pt to verbalize understanding with visual scanning strategies and activities to encourage visual scanning Baseline: Goal status: IN PROGRESS  2.  Pt independent with high level coordination HEP  Baseline:  Goal status: IN PROGRESS  3.  Pt to perform tabletop scanning at 90% accuracy Baseline:  Goal status: IN PROGRESS   LONG TERM GOALS: Target date: 04/24/24  Pt to perform environmental scanning at 85% accuracy or higher with simple physical task Baseline:  Goal status: IN PROGRESS  2.  Pt to sufficiently type for work related tasks including entering/translating correct info on the computer Baseline:  Goal status: INITIAL  3.  Pt to demo ability to perform problem solving task for work simulation w/ task modifications/external cues prn with no verbal cues Baseline:  Goal status:  INITIAL  4.  Improve coordination as evidenced by performing 9 hole peg test in 25 sec or less Baseline: b/t 28 and 36 sec.  Goal status: INITIAL   ASSESSMENT:  CLINICAL IMPRESSION: Pt verbalizes and demonstrates good understanding of strategies and activities as needed to progress towards goals. Will go over sleep hygiene and eye fatigue at next session.    PERFORMANCE DEFICITS: in functional skills including IADLs, coordination, Fine motor control, decreased knowledge of use of DME, and vision, cognitive skills including memory, perception, problem solving, and safety awareness.   IMPAIRMENTS: are limiting patient from IADLs, work, and leisure.   CO-MORBIDITIES: may have co-morbidities  that affects occupational performance. Patient will benefit from skilled OT to address above impairments and improve overall function.  EVALUATION COMPLEXITY: Moderate  PLAN:  OT FREQUENCY: 2x/week  OT DURATION: 6 weeks  PLANNED INTERVENTIONS: 97535  self care/ADL training, 02889 therapeutic exercise, 97530 therapeutic activity, 97112 neuromuscular re-education, 97129 Cognitive training (first 15 min), 02869 Cognitive training(each additional 15 min), visual/perceptual remediation/compensation, patient/family education, and DME and/or AE instructions  RECOMMENDED OTHER SERVICES: Speech therapy services  CONSULTED AND AGREED WITH PLAN OF CARE: Patient and family member/caregiver  PLAN FOR NEXT SESSION: color suduko game, review golf solitaire PRN, symbol digits worksheet, transposing written things from book/magazine to computer, 48 pc puzzle, issue pencil line guide prn; sleep hygiene and eye fatigue (brain naps)  Jocelyn CHRISTELLA Bottom, OT 03/24/2024, 5:42 PM

## 2024-03-26 ENCOUNTER — Ambulatory Visit: Admitting: Occupational Therapy

## 2024-03-31 DIAGNOSIS — N401 Enlarged prostate with lower urinary tract symptoms: Secondary | ICD-10-CM | POA: Diagnosis not present

## 2024-03-31 DIAGNOSIS — I671 Cerebral aneurysm, nonruptured: Secondary | ICD-10-CM | POA: Diagnosis not present

## 2024-03-31 DIAGNOSIS — R351 Nocturia: Secondary | ICD-10-CM | POA: Diagnosis not present

## 2024-03-31 DIAGNOSIS — R3915 Urgency of urination: Secondary | ICD-10-CM | POA: Diagnosis not present

## 2024-04-01 ENCOUNTER — Ambulatory Visit: Attending: Physician Assistant | Admitting: Occupational Therapy

## 2024-04-01 ENCOUNTER — Ambulatory Visit: Admitting: Speech Pathology

## 2024-04-01 ENCOUNTER — Ambulatory Visit: Admitting: Physical Therapy

## 2024-04-01 DIAGNOSIS — R41841 Cognitive communication deficit: Secondary | ICD-10-CM | POA: Insufficient documentation

## 2024-04-01 DIAGNOSIS — M6281 Muscle weakness (generalized): Secondary | ICD-10-CM | POA: Diagnosis not present

## 2024-04-01 DIAGNOSIS — R278 Other lack of coordination: Secondary | ICD-10-CM | POA: Diagnosis not present

## 2024-04-01 DIAGNOSIS — R41842 Visuospatial deficit: Secondary | ICD-10-CM | POA: Insufficient documentation

## 2024-04-01 DIAGNOSIS — Z86008 Personal history of in-situ neoplasm of other site: Secondary | ICD-10-CM | POA: Diagnosis not present

## 2024-04-01 DIAGNOSIS — R480 Dyslexia and alexia: Secondary | ICD-10-CM

## 2024-04-01 DIAGNOSIS — I69118 Other symptoms and signs involving cognitive functions following nontraumatic intracerebral hemorrhage: Secondary | ICD-10-CM | POA: Insufficient documentation

## 2024-04-01 NOTE — Therapy (Signed)
 OUTPATIENT OCCUPATIONAL THERAPY NEURO TREATMENT   Patient Name: Dakota Flores MRN: [id number] DOB:03/27/1961, 63 y.o., male Today's Date: 04/01/2024  PCP: Cristopher Bottcher, NP REFERRING PROVIDER: Valora Tawni BIRCH, PA-C  END OF SESSION:  OT End of Session - 04/01/24 1021     Visit Number 4    Number of Visits 13    Date for OT Re-Evaluation 04/24/24   requested reduction in frequecy with extended duration 8/6; monitor need to extend POC   Authorization Type BC/BS - 25 VL combined (used 4), no auth required    Authorization - Number of Visits 21    OT Start Time 1021    OT Stop Time 1100    OT Time Calculation (min) 39 min    Activity Tolerance Patient tolerated treatment well    Behavior During Therapy WFL for tasks assessed/performed         Past Medical History:  Diagnosis Date   Depression    Past Surgical History:  Procedure Laterality Date   COLONOSCOPY  08/2017   LASIK  2005   VASECTOMY     WISDOM TOOTH EXTRACTION     There are no active problems to display for this patient.  ONSET DATE: 03/03/2024 (referral date)   REFERRING DIAG: I67.1 (ICD-10-CM) - Cerebral aneurysm, nonruptured  THERAPY DIAG:  Other lack of coordination  Visuospatial deficit  Other symptoms and signs involving cognitive functions following nontraumatic intracerebral hemorrhage  Muscle weakness (generalized)  Rationale for Evaluation and Treatment: Rehabilitation  SUBJECTIVE:   SUBJECTIVE STATEMENT: He was advised to take Aleve for his neck and shoulder pain, which has helped. He was also prescribed a muscle relaxer PRN but hasn't used it.  Pt accompanied by: self and significant other ([name])  PERTINENT HISTORY:  63 y.o. 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 Yes: NPRS scale: 5/10 Pain location: L shoulder and neck Pain description: tightness Aggravating factors: increases over night Relieving factors: stretches  FALLS: Has patient fallen in last 6 months? No  LIVING ENVIRONMENT: Lives with: lives with their spouse Lives in: 1 level house, 3 steps to enter from front Has following equipment at home: None  PLOF: Independent, Leisure: tennis, home improvement, and works as Pensions consultant for [employer]  PATIENT GOALS: return to normal vision and reading   OBJECTIVE:  Note: Objective measures were completed at Evaluation unless otherwise noted.  HAND DOMINANCE: Right  ADLs: Overall ADLs: independent Transfers/ambulation related to ADLs: independent Eating: independent Grooming: mod I  UB Dressing: independent LB Dressing: independent Toileting: independent Bathing: mod I  Tub Shower transfers: mod I  Equipment: none  IADLs: Shopping: has not yet done Light housekeeping: pt has returned to light cleaning Meal Prep: wife has always done Community mobility: currently unable to drive Medication management: independent Financial management: not yet attempted since d/c from hospital  Handwriting: no changes  MOBILITY STATUS: Independent   UPPER EXTREMITY ROM:  BUE AROM WNLs  UPPER EXTREMITY MMT:   BUE MMT grossly 5/5  HAND FUNCTION: Grip strength: Right: 103.1 lbs; Left: 105.3 lbs  COORDINATION: 9 Hole Peg test: Right: 36.48 sec initially (pt kept pulling pegs back out after placing d/t ? Sweaty fingers), 2nd trial = 28.68 sec; Left: 25.04 sec  SENSATION: WFL  EDEMA: very mild Rt hand  COGNITION: Overall cognitive status: pt with max difficulty spelling  world backwards/decreased processing and significantly extra time, self corrected error. Pt also demo some mild difficulty following directions, but could also be due to [facility]. 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  Patient has difficulty with following activities due to following visual impairments: reading  PERCEPTION: Not tested  PRAXIS: Not tested  OBSERVATIONS: significant Rt homonymous hemianopsia to midline, higher level cognitive deficits                                                                                                                           TREATMENT:  OT reviewed table top play of Golf Solitaire for LUE to address fine motor coordination, gross motor coordination, upper extremity range of motion, pain reduction, scanning and locating of items, processing, and bimanual coordination/trunk control. Pt required minimal cues for proper play.    Patient engaged in color Sudoku activity using bilaterally to address attention, problem-solving, deductive reasoning, visual scanning, tolerance, and task persistence, and spatial organization. Required occasional redirection to check for duplicates and minimal cueing for proper play.    Utilized Trash Dice game to work on picking up individual small objects/dice (x40). The object of the game is to keep the most dice and not have to discard your dice into the trash can during turn taking.  Pt engaged in picking up dice  60+ times each game. Tasks included  -sorting dice by color (20 to each person) -practicing picking up 1 at a time until pt had 5+ in palm to sort, roll or drop 1 at a time -picking up and rolling dice individually with cues to supinate forearm -placing dice in containers - larger opening of can or smaller square for dice in lid -turning over trash can lid to keep the 6 dice placed there through turn taking  PATIENT EDUCATION: Education details: SEE ABOVE Person educated: Patient and Spouse Education method: Explanation, Demonstration, Verbal cues, and Handouts Education comprehension: verbalized understanding, returned demonstration, and verbal cues required  HOME EXERCISE PROGRAM: 03/17/24: High level coordination HEP, visual scanning strategies and activities 04/01/2024: golf solitaire  GOALS: Goals reviewed with patient? Yes  SHORT TERM GOALS: Target date: 04/03/24  Pt to verbalize understanding with visual scanning strategies and activities to  encourage visual scanning Baseline: Goal status: IN PROGRESS  2.  Pt independent with high level coordination HEP  Baseline:  Goal status: IN PROGRESS  3.  Pt to perform tabletop scanning at 90% accuracy Baseline:  Goal status: IN PROGRESS   LONG TERM GOALS: Target date: 04/24/24  Pt to perform environmental scanning at 85% accuracy or higher with simple physical task Baseline:  Goal status: IN PROGRESS  2.  Pt to sufficiently type for work related tasks including entering/translating correct info on the computer Baseline:  Goal status: INITIAL  3.  Pt to demo ability to perform problem solving task for work simulation w/ task modifications/external cues prn with no verbal cues Baseline:  Goal status: INITIAL  4.  Improve coordination as evidenced by performing 9 hole peg test in 25 sec or less Baseline: b/t 28 and 36 sec.  Goal status: INITIAL   ASSESSMENT:  CLINICAL IMPRESSION: Pt verbalizes and demonstrates good  understanding of activities as needed to progress towards goals though requires increased time for processing. Will go over sleep hygiene and eye fatigue at next session.   PERFORMANCE DEFICITS: in functional skills including IADLs, coordination, Fine motor control, decreased knowledge of use of DME, and vision, cognitive skills including memory, perception, problem solving, and safety awareness.   IMPAIRMENTS: are limiting patient from IADLs, work, and leisure.   CO-MORBIDITIES: may have co-morbidities  that affects occupational performance. Patient will benefit from skilled OT to address above impairments and improve overall function.  EVALUATION COMPLEXITY: Moderate  PLAN:  OT FREQUENCY: 2x/week  OT DURATION: 6 weeks  PLANNED INTERVENTIONS: 97535 self care/ADL training, 02889 therapeutic exercise, 97530 therapeutic activity, 97112 neuromuscular re-education, 97129 Cognitive training (first 15 min), 02869 Cognitive training(each additional 15 min), visual/perceptual remediation/compensation, patient/family education, and DME and/or AE instructions  RECOMMENDED OTHER SERVICES: Speech therapy services  CONSULTED AND AGREED WITH PLAN OF CARE: Patient and family member/caregiver  PLAN FOR NEXT SESSION: symbol digits worksheet, transposing written things from book/magazine to computer, 48 pc puzzle, issue pencil line guide prn; sleep hygiene and eye fatigue (brain naps)  Jocelyn CHRISTELLA Bottom, OT 04/01/2024, 1:30 PM

## 2024-04-01 NOTE — Therapy (Signed)
 OUTPATIENT SPEECH LANGUAGE PATHOLOGY TREATMENT   Patient Name: Dakota Flores MRN: [id number] DOB:1961-04-04, 63 y.o., male Today's Date: 04/01/2024  PCP: Dakota Bottcher, NP REFERRING PROVIDER: Cristopher Bottcher, NP  END OF SESSION:  End of Session - 04/01/24 1108     Visit Number 4    Number of Visits 9    Date for SLP Re-Evaluation 05/04/24    Authorization Type 25 total for all disciplines    SLP Start Time 1103    SLP Stop Time  1145    SLP Time Calculation (min) 42 min    Activity Tolerance Patient tolerated treatment well           Past Medical History:  Diagnosis Date   Depression    Past Surgical History:  Procedure Laterality Date   COLONOSCOPY  08/2017   LASIK  2005   VASECTOMY     WISDOM TOOTH EXTRACTION     There are no active problems to display for this patient.   ONSET DATE: 02/16/24   REFERRING DIAG: R41.89 (ICD-10-CM) - Other symptoms and signs involving cognitive functions and awareness  THERAPY DIAG:  Dyslexia and alexia  Rationale for Evaluation and Treatment: Rehabilitation  SUBJECTIVE:   SUBJECTIVE STATEMENT: He is good mentally, it's the reading processing Pt accompanied by: significant other, Dakota Flores  PERTINENT HISTORY: 63 y.o. 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 No  FALLS: Has patient fallen in last 6 months?  See PT evaluation for details  LIVING ENVIRONMENT: Lives with: lives with their spouse Lives in: House/apartment  PLOF:  Level of assistance: Independent with ADLs, Independent with IADLs Employment: Data processing manager for [employer]  PATIENT GOALS: To return to work if  able  OBJECTIVE:  Note: Objective measures were completed at Evaluation unless otherwise noted.   COGNITION: Overall cognitive status: Within functional limits for tasks assessed Areas of impairment:   Functional deficits: reading comprehension/processing  COGNITIVE COMMUNICATION: Following directions: Follows multi-step commands consistently  Auditory comprehension: WFL Verbal expression: WFL Functional communication: Impaired: unable to read/comprehend texts and e mails  ORAL MOTOR EXAMINATION: Overall status: WFL Comments:   STANDARDIZED ASSESSMENTS: Subtests of RCBA: Word-Visual - 8/10 - extended time consistently Word-Semantic 8/10 - extended time consistently Sentence-Picture 0/3 - not completed due to extended time   PATIENT REPORTED OUTCOME MEASURES (PROM): Cognitive Function: 133/140 - all items a 5 or no difficulty except a 2 (a lot of difficulty_) reading complex instructions and a 1,very often difficulty reading something several times to understand it  TREATMENT DATE:   04/01/24: Targeted reading comprehension in simple to moderately complex sentence unscramble - With extended time and occasional min verbal cues, he successfully reading and decoded sentences. Unscramble words with known topic with rare min A. Reading comprehension targeted in reading and ordering 4 step social tasks with extended time and rare min A.   03/24/24: SLP assisted pt in modifying accessibility features on phone. Pt able to demonstrate with min-A verbal cues reading of text messages. Discussed variety of HEP activities to support return to reading. Addressed reading comprehension of mod complex words with atypical spellings with pt achieving 100% accuracy given extra time and occasional min-A. Pt generates antonym in 100% of opportunities demonstrating intact written skills.  Demonstrated strategies for decoding including syllabification, written support d/t intact written expression.   03/18/24: Assessed comprehension of numbers - 10/10 accurate. Targeted reading a word level identifying synonyms out of field of 3 with extended time and usual min verbal cues to ID and correct errors. Scanning page with extended time and mod I. Targeted reading comprehension at word level, identifying and spelling words with fill in the blanks with usual min verbal cues. Spelling with mod I. He denies eye fatigue or strain  03/10/23: Eval only due to time constraints    PATIENT EDUCATION: Education details: areas of impairment, goals Person educated: Patient and Spouse Education method: Explanation and Verbal cues Education comprehension: needs further education   GOALS: Goals reviewed with patient? Yes  SHORT TERM GOALS: Target date: 04/20/24  Pt will scan left to right to see 3-4 words on page with rare min A Baseline: Goal status: ONGOING  2.  Pt will ID word f:4 with extended time 4/5x  Baseline:  Goal status: ONGOING  3.  Pt will read and comprehend 5 -7 word text with rare min A and extended time Baseline:  Goal status: ONGOING  4.  Pt will generate and edit 3 sentence email with occasional min A Baseline:  Goal status: ONGOING  LONG TERM GOALS: Target date: 05/05/24  Pt will read and comprehend 3-4 sentence paragraph with occasional min A Baseline:  Goal status: ONGOING  2.  Pt will independently scan reading material and self correct with occasional min A Baseline:  Goal status: ONGOING  3.  Pt will generate and correct 5 sentence document with occasional min A Baseline:  Goal status: ONGOING  4.  Pt will carryover 2 compensations for reading comprehension for emails and work (text to speech, spech to text) with mod I Baseline:  Goal status: ONGOING   ASSESSMENT:  CLINICAL IMPRESSION: Patient is a 63 y.o. male who was seen today for cognitive  communication assessment. 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 Despite being able to manipulate large print words, Dakota Flores demonstrates impaired comprehension at word level. Unable to comprehend sentence levels despite compensations for visual impairment. Dakota Flores reports difficulty understanding texts and emails. He wrote simple to moderately complex sentences to dictation accurately. They both deny any other cognitive changes - Dakota Flores is completing house hold tasks, instructing Dakota Flores on financial management, managing the remote control, microwave with success. They both deny any word finding difficulties and report he passes his naming test in the hospital. I recommend skilled ST to maximize reading comprehension for independence, safety and QOL.    OBJECTIVE IMPAIRMENTS: include receptive language and reading comprehension. These impairments are limiting patient from return to work, managing finances, and effectively communicating at home and in community. Factors affecting potential to  achieve goals and functional outcome are n/a.SABRA Patient will benefit from skilled SLP services to address above impairments and improve overall function.  REHAB POTENTIAL: Good  PLAN:  SLP FREQUENCY: 1-2x/week  SLP DURATION: 8 weeks  PLANNED INTERVENTIONS: Language facilitation, Environmental controls, Cueing hierachy, Cognitive reorganization, Internal/external aids, Functional tasks, Multimodal communication approach, SLP instruction and feedback, Compensatory strategies, Patient/family education, [phone] Treatment of speech (30 or 45 min) , and [id number]- Speech Eval Sound Prod, Articulate, Phonological    Jacques Willingham, Leita Caldron, CCC-SLP 04/01/2024, 11:50 AM

## 2024-04-03 ENCOUNTER — Ambulatory Visit: Admitting: Occupational Therapy

## 2024-04-06 DIAGNOSIS — Z9889 Other specified postprocedural states: Secondary | ICD-10-CM | POA: Diagnosis not present

## 2024-04-06 DIAGNOSIS — I770 Arteriovenous fistula, acquired: Secondary | ICD-10-CM | POA: Diagnosis not present

## 2024-04-06 DIAGNOSIS — I671 Cerebral aneurysm, nonruptured: Secondary | ICD-10-CM | POA: Diagnosis not present

## 2024-04-07 ENCOUNTER — Ambulatory Visit: Admitting: Speech Pathology

## 2024-04-07 DIAGNOSIS — R278 Other lack of coordination: Secondary | ICD-10-CM | POA: Diagnosis not present

## 2024-04-07 DIAGNOSIS — R480 Dyslexia and alexia: Secondary | ICD-10-CM | POA: Diagnosis not present

## 2024-04-07 DIAGNOSIS — R41841 Cognitive communication deficit: Secondary | ICD-10-CM | POA: Diagnosis not present

## 2024-04-07 DIAGNOSIS — M6281 Muscle weakness (generalized): Secondary | ICD-10-CM | POA: Diagnosis not present

## 2024-04-07 DIAGNOSIS — R41842 Visuospatial deficit: Secondary | ICD-10-CM | POA: Diagnosis not present

## 2024-04-07 DIAGNOSIS — I69118 Other symptoms and signs involving cognitive functions following nontraumatic intracerebral hemorrhage: Secondary | ICD-10-CM | POA: Diagnosis not present

## 2024-04-07 NOTE — Therapy (Signed)
 OUTPATIENT SPEECH LANGUAGE PATHOLOGY TREATMENT   Patient Name: Dakota Flores MRN: [id number] DOB:[birth year], 63 y.o., male Today's Date: 04/07/2024  PCP: Cristopher Bottcher, NP REFERRING PROVIDER: Cristopher Bottcher, NP  END OF SESSION:  End of Session - 04/07/24 0801     Visit Number 5    Number of Visits 9    Date for SLP Re-Evaluation 05/04/24    Authorization Type 25 total for all disciplines    Authorization - Number of Visits 25    SLP Start Time 0803    SLP Stop Time  0845    SLP Time Calculation (min) 42 min    Activity Tolerance Patient tolerated treatment well           Past Medical History:  Diagnosis Date   Depression    Past Surgical History:  Procedure Laterality Date   COLONOSCOPY  08/2017   LASIK  2005   VASECTOMY     WISDOM TOOTH EXTRACTION     There are no active problems to display for this patient.   ONSET DATE: 02/16/24   REFERRING DIAG: R41.89 (ICD-10-CM) - Other symptoms and signs involving cognitive functions and awareness  THERAPY DIAG:  Dyslexia and alexia  Rationale for Evaluation and Treatment: Rehabilitation  SUBJECTIVE:   SUBJECTIVE STATEMENT: Pt with good results from neurology visit yesterday.   PERTINENT HISTORY: 63 y.o. 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 No  FALLS: Has patient fallen in last 6 months?  See PT evaluation for details  LIVING ENVIRONMENT: Lives with: lives with their spouse Lives in: House/apartment  PLOF:  Level of assistance: Independent with ADLs, Independent with IADLs Employment: Data processing manager for [employer]  PATIENT GOALS: To return to work if  able  OBJECTIVE:  Note: Objective measures were completed at Evaluation unless otherwise noted.   COGNITION: Overall cognitive status: Within functional limits for tasks assessed Areas of impairment:   Functional deficits: reading comprehension/processing  COGNITIVE COMMUNICATION: Following directions: Follows multi-step commands consistently  Auditory comprehension: WFL Verbal expression: WFL Functional communication: Impaired: unable to read/comprehend texts and e mails  ORAL MOTOR EXAMINATION: Overall status: WFL Comments:   STANDARDIZED ASSESSMENTS: Subtests of RCBA: Word-Visual - 8/10 - extended time consistently Word-Semantic 8/10 - extended time consistently Sentence-Picture 0/3 - not completed due to extended time   PATIENT REPORTED OUTCOME MEASURES (PROM): Cognitive Function: 133/140 - all items a 5 or no difficulty except a 2 (a lot of difficulty_) reading complex instructions and a 1,very often difficulty reading something several times to understand it  TREATMENT DATE:   04/07/24: Target decoding and reading comprehension via paragraph reading of increasing lengths. No evidence of visual neglect impacting reading abilities this date. 2 sentence paragraphs read with minor errors for reading fluency, answers comprehension questions with 90% accuracy. 4 sentence paragraph, pt used visual aid for visual orientation. Had to re-read to increase fluency and accuracy of reading. Answers comprehension questions with 100% accuracy.   04/01/24: Targeted reading comprehension in simple to moderately complex sentence unscramble - With extended time and occasional min verbal cues, he successfully reading and decoded sentences. Unscramble words with known topic with rare min A. Reading comprehension targeted in reading and ordering 4 step social tasks with extended  time and rare min A.   03/24/24: SLP assisted pt in modifying accessibility features on phone. Pt able to demonstrate with min-A verbal cues reading of text messages. Discussed variety of HEP activities to support return to reading. Addressed reading comprehension of mod complex words with atypical spellings with pt achieving 100% accuracy given extra time and occasional min-A. Pt generates antonym in 100% of opportunities demonstrating intact written skills. Demonstrated strategies for decoding including syllabification, written support d/t intact written expression.   03/18/24: Assessed comprehension of numbers - 10/10 accurate. Targeted reading a word level identifying synonyms out of field of 3 with extended time and usual min verbal cues to ID and correct errors. Scanning page with extended time and mod I. Targeted reading comprehension at word level, identifying and spelling words with fill in the blanks with usual min verbal cues. Spelling with mod I. He denies eye fatigue or strain  03/10/23: Eval only due to time constraints    PATIENT EDUCATION: Education details: areas of impairment, goals Person educated: Patient and Spouse Education method: Explanation and Verbal cues Education comprehension: needs further education   GOALS: Goals reviewed with patient? Yes  SHORT TERM GOALS: Target date: 04/20/24  Pt will scan left to right to see 3-4 words on page with rare min A Baseline: 04/07/24 Goal status: MET  2.  Pt will ID word f:4 with extended time 4/5x  Baseline:  Goal status: ONGOING  3.  Pt will read and comprehend 5 -7 word text with rare min A and extended time Baseline:  Goal status: ONGOING  4.  Pt will generate and edit 3 sentence email with occasional min A Baseline:  Goal status: ONGOING  LONG TERM GOALS: Target date: 05/05/24  Pt will read and comprehend 3-4 sentence paragraph with occasional min A Baseline:  Goal status: ONGOING  2.  Pt will independently scan  reading material and self correct with occasional min A Baseline:  Goal status: ONGOING  3.  Pt will generate and correct 5 sentence document with occasional min A Baseline:  Goal status: ONGOING  4.  Pt will carryover 2 compensations for reading comprehension for emails and work (text to speech, spech to text) with mod I Baseline:  Goal status: ONGOING   ASSESSMENT:  CLINICAL IMPRESSION: Patient is a 63 y.o. male who was seen today for cognitive communication assessment. 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 Despite being able to manipulate large print words, [name] demonstrates impaired comprehension at word level. Unable to comprehend sentence levels despite compensations for visual impairment. [name] reports difficulty understanding texts and emails. He wrote simple to moderately complex sentences to dictation accurately. They both deny any other cognitive changes - [name] is completing house hold tasks, instructing [name] on financial management, managing the remote control,  microwave with success. They both deny any word finding difficulties and report he passes his naming test in the hospital. I recommend skilled ST to maximize reading comprehension for independence, safety and QOL.    OBJECTIVE IMPAIRMENTS: include receptive language and reading comprehension. These impairments are limiting patient from return to work, managing finances, and effectively communicating at home and in community. Factors affecting potential to achieve goals and functional outcome are n/a.SABRA Patient will benefit from skilled SLP services to address above impairments and improve overall function.  REHAB POTENTIAL: Good  PLAN:  SLP FREQUENCY: 1-2x/week  SLP DURATION: 8 weeks  PLANNED INTERVENTIONS: Language facilitation, Environmental controls, Cueing hierachy, Cognitive reorganization, Internal/external aids, Functional tasks, Multimodal communication approach, SLP  instruction and feedback, Compensatory strategies, Patient/family education, [phone] Treatment of speech (30 or 45 min) , and [id number]- Speech Eval Sound Prod, Articulate, Phonological    Harlene LITTIE Ned, CCC-SLP 04/07/2024, 8:02 AM

## 2024-04-14 ENCOUNTER — Ambulatory Visit: Admitting: Speech Pathology

## 2024-04-14 DIAGNOSIS — M6281 Muscle weakness (generalized): Secondary | ICD-10-CM | POA: Diagnosis not present

## 2024-04-14 DIAGNOSIS — I69118 Other symptoms and signs involving cognitive functions following nontraumatic intracerebral hemorrhage: Secondary | ICD-10-CM | POA: Diagnosis not present

## 2024-04-14 DIAGNOSIS — R480 Dyslexia and alexia: Secondary | ICD-10-CM

## 2024-04-14 DIAGNOSIS — R278 Other lack of coordination: Secondary | ICD-10-CM | POA: Diagnosis not present

## 2024-04-14 DIAGNOSIS — R41842 Visuospatial deficit: Secondary | ICD-10-CM | POA: Diagnosis not present

## 2024-04-14 DIAGNOSIS — R41841 Cognitive communication deficit: Secondary | ICD-10-CM | POA: Diagnosis not present

## 2024-04-14 NOTE — Therapy (Signed)
 OUTPATIENT SPEECH LANGUAGE PATHOLOGY TREATMENT   Patient Name: Dakota Flores MRN: [id number] DOB:[birth year], 63 y.o., male Today's Date: 04/14/2024  PCP: Cristopher Bottcher, NP REFERRING PROVIDER: Cristopher Bottcher, NP  END OF SESSION:  End of Session - 04/14/24 0846     Visit Number 6    Number of Visits 9    Date for SLP Re-Evaluation 05/04/24    Authorization Type 25 total for all disciplines    Authorization - Number of Visits 25    SLP Start Time [phone]    SLP Stop Time  0930    SLP Time Calculation (min) 44 min    Activity Tolerance Patient tolerated treatment well           Past Medical History:  Diagnosis Date   Depression    Past Surgical History:  Procedure Laterality Date   COLONOSCOPY  08/2017   LASIK  2005   VASECTOMY     WISDOM TOOTH EXTRACTION     There are no active problems to display for this patient.   ONSET DATE: 02/16/24   REFERRING DIAG: R41.89 (ICD-10-CM) - Other symptoms and signs involving cognitive functions and awareness  THERAPY DIAG:  Dyslexia and alexia  Rationale for Evaluation and Treatment: Rehabilitation  SUBJECTIVE:   SUBJECTIVE STATEMENT: Pt reports improving vision regarding complaints from last session.  PERTINENT HISTORY: 63 y.o. 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 No  FALLS: Has patient fallen in last 6 months?  See PT evaluation for details  LIVING ENVIRONMENT: Lives with: lives with their spouse Lives in: House/apartment  PLOF:  Level of assistance: Independent with ADLs, Independent with IADLs Employment: Data processing manager for [employer]  PATIENT GOALS: To return to  work if able  OBJECTIVE:  Note: Objective measures were completed at Evaluation unless otherwise noted.   COGNITION: Overall cognitive status: Within functional limits for tasks assessed Areas of impairment:   Functional deficits: reading comprehension/processing  COGNITIVE COMMUNICATION: Following directions: Follows multi-step commands consistently  Auditory comprehension: WFL Verbal expression: WFL Functional communication: Impaired: unable to read/comprehend texts and e mails  ORAL MOTOR EXAMINATION: Overall status: WFL Comments:   STANDARDIZED ASSESSMENTS: Subtests of RCBA: Word-Visual - 8/10 - extended time consistently Word-Semantic 8/10 - extended time consistently Sentence-Picture 0/3 - not completed due to extended time   PATIENT REPORTED OUTCOME MEASURES (PROM): Cognitive Function: 133/140 - all items a 5 or no difficulty except a 2 (a lot of difficulty_) reading complex instructions and a 1,very often difficulty reading something several times to understand it  TREATMENT DATE:   04/14/24: Target decoding and reading comprehension via hierarchical tasks. For short sentence analogy completion, pt with occasional errors with accuracy of decoing (~5%), corrects with rare verbal cue, x1 instance of needing re-written to aid in accurate decoding. Completes analogy correctly 24/25 opportunities, with rare verbal cues needed, indicating intact comprehension. In reading short paragraphs (4 sentences) and generating appropriate solution, pt with mildly worse fluency noted vs sentence level task, rare decoding errors, 100% success with generating solution. Seems to have increased challenge with increased information vs potential visual fatigue factor. Will trial longer stimuli first next session.   04/07/24: Target decoding and reading comprehension via  paragraph reading of increasing lengths. No evidence of visual neglect impacting reading abilities this date. 2 sentence paragraphs read with minor errors for reading fluency, answers comprehension questions with 90% accuracy. 4 sentence paragraph, pt used visual aid for visual orientation. Had to re-read to increase fluency and accuracy of reading. Answers comprehension questions with 100% accuracy.   04/01/24: Targeted reading comprehension in simple to moderately complex sentence unscramble - With extended time and occasional min verbal cues, he successfully reading and decoded sentences. Unscramble words with known topic with rare min A. Reading comprehension targeted in reading and ordering 4 step social tasks with extended time and rare min A.   03/24/24: SLP assisted pt in modifying accessibility features on phone. Pt able to demonstrate with min-A verbal cues reading of text messages. Discussed variety of HEP activities to support return to reading. Addressed reading comprehension of mod complex words with atypical spellings with pt achieving 100% accuracy given extra time and occasional min-A. Pt generates antonym in 100% of opportunities demonstrating intact written skills. Demonstrated strategies for decoding including syllabification, written support d/t intact written expression.   03/18/24: Assessed comprehension of numbers - 10/10 accurate. Targeted reading a word level identifying synonyms out of field of 3 with extended time and usual min verbal cues to ID and correct errors. Scanning page with extended time and mod I. Targeted reading comprehension at word level, identifying and spelling words with fill in the blanks with usual min verbal cues. Spelling with mod I. He denies eye fatigue or strain  03/10/23: Eval only due to time constraints    PATIENT EDUCATION: Education details: areas of impairment, goals Person educated: Patient and Spouse Education method: Explanation and Verbal  cues Education comprehension: needs further education   GOALS: Goals reviewed with patient? Yes  SHORT TERM GOALS: Target date: 04/20/24  Pt will scan left to right to see 3-4 words on page with rare min A Baseline: 04/07/24, 04/14/24 Goal status: MET  2.  Pt will ID word f:4 with extended time 4/5x  Baseline:  Goal status: MET  3.  Pt will read and comprehend 5 -7 word text with rare min A and extended time Baseline: 04/14/24 Goal status: MET  4.  Pt will generate and edit 3 sentence email with occasional min A Baseline:  Goal status: ONGOING  LONG TERM GOALS: Target date: 05/05/24  Pt will read and comprehend 3-4 sentence paragraph with occasional min A Baseline:  Goal status: ONGOING  2.  Pt will independently scan reading material and self correct with occasional min A Baseline:  Goal status: ONGOING  3.  Pt will generate and correct 5 sentence document with occasional min A Baseline:  Goal status: ONGOING  4.  Pt will carryover 2 compensations for reading comprehension for emails and work (text to speech, spech to text) with  mod I Baseline:  Goal status: ONGOING   ASSESSMENT:  CLINICAL IMPRESSION: Patient is a 63 y.o. male who was seen today for cognitive communication assessment. 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 Despite being able to manipulate large print words, [name] demonstrates impaired comprehension at word level. Unable to comprehend sentence levels despite compensations for visual impairment. [name] reports difficulty understanding texts and emails. He wrote simple to moderately complex sentences to dictation accurately. They both deny any other cognitive changes - [name] is completing house hold tasks, instructing [name] on financial management, managing the remote control, microwave with success. They both deny any word finding difficulties and report he passes his naming test in the hospital. I recommend skilled ST to  maximize reading comprehension for independence, safety and QOL.    OBJECTIVE IMPAIRMENTS: include receptive language and reading comprehension. These impairments are limiting patient from return to work, managing finances, and effectively communicating at home and in community. Factors affecting potential to achieve goals and functional outcome are n/a.SABRA Patient will benefit from skilled SLP services to address above impairments and improve overall function.  REHAB POTENTIAL: Good  PLAN:  SLP FREQUENCY: 1-2x/week  SLP DURATION: 8 weeks  PLANNED INTERVENTIONS: Language facilitation, Environmental controls, Cueing hierachy, Cognitive reorganization, Internal/external aids, Functional tasks, Multimodal communication approach, SLP instruction and feedback, Compensatory strategies, Patient/family education, [phone] Treatment of speech (30 or 45 min) , and 07477- Speech Eval Sound Prod, Articulate, Phonological    Harlene LITTIE Ned, CCC-SLP 04/14/2024, 8:46 AM

## 2024-04-16 ENCOUNTER — Ambulatory Visit: Admitting: Occupational Therapy

## 2024-04-16 DIAGNOSIS — I69118 Other symptoms and signs involving cognitive functions following nontraumatic intracerebral hemorrhage: Secondary | ICD-10-CM | POA: Diagnosis not present

## 2024-04-16 DIAGNOSIS — M6281 Muscle weakness (generalized): Secondary | ICD-10-CM | POA: Diagnosis not present

## 2024-04-16 DIAGNOSIS — R41842 Visuospatial deficit: Secondary | ICD-10-CM

## 2024-04-16 DIAGNOSIS — R480 Dyslexia and alexia: Secondary | ICD-10-CM | POA: Diagnosis not present

## 2024-04-16 DIAGNOSIS — R278 Other lack of coordination: Secondary | ICD-10-CM

## 2024-04-16 DIAGNOSIS — R41841 Cognitive communication deficit: Secondary | ICD-10-CM | POA: Diagnosis not present

## 2024-04-16 NOTE — Therapy (Signed)
 OUTPATIENT OCCUPATIONAL THERAPY NEURO TREATMENT   Patient Name: STEELE Flores MRN: [id number] DOB:1961/07/24, 63 y.o., male Today's Date: 04/16/2024  PCP: Cristopher Bottcher, NP REFERRING PROVIDER: Valora Tawni BIRCH, PA-C  END OF SESSION:  OT End of Session - 04/16/24 0936     Visit Number 5    Number of Visits 13    Date for OT Re-Evaluation 04/24/24   requested reduction in frequecy with extended duration 8/6; monitor need to extend POC   Authorization Type BC/BS - 25 VL combined (used 4), no auth required    Authorization - Number of Visits 21    OT Start Time [phone]    OT Stop Time 1015    OT Time Calculation (min) 41 min    Activity Tolerance Patient tolerated treatment well    Behavior During Therapy [facility] for tasks assessed/performed         Past Medical History:  Diagnosis Date   Depression    Past Surgical History:  Procedure Laterality Date   COLONOSCOPY  08/2017   LASIK  2005   VASECTOMY     WISDOM TOOTH EXTRACTION     There are no active problems to display for this patient.  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   Pt accompanied by: self and significant other (Dakota Flores)  PERTINENT HISTORY:  63 y.o. 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 Yes: NPRS scale: 5/10 Pain location: L shoulder and neck Pain description: tightness Aggravating factors: increases over night Relieving factors: stretches  FALLS: Has patient fallen in last 6 months? No  LIVING ENVIRONMENT: Lives with: lives with their spouse Lives in: 1 level house, 3 steps to enter from front Has following equipment at home: None  PLOF: Independent, Leisure: tennis, home improvement, and works as Pensions consultant for [employer]  PATIENT GOALS: return to normal vision and reading   OBJECTIVE:  Note: Objective measures were completed at Evaluation unless otherwise noted.  HAND DOMINANCE: Right  ADLs: Overall ADLs: independent Transfers/ambulation related to ADLs: independent Eating: independent Grooming: mod I  UB Dressing: independent LB Dressing: independent Toileting: independent Bathing: mod I  Tub Shower transfers: mod I  Equipment: none  IADLs: Shopping: has not yet done Light housekeeping: pt has returned to light cleaning Meal Prep: wife has always done Community mobility: currently unable to drive Medication management: independent Financial management: not yet attempted since d/c from hospital  Handwriting: no changes  MOBILITY STATUS: Independent   UPPER EXTREMITY ROM:  BUE AROM WNLs  UPPER EXTREMITY MMT:   BUE MMT grossly 5/5  HAND FUNCTION: Grip strength: Right: 103.1 lbs; Left: 105.3 lbs  COORDINATION: 9 Hole Peg test: Right: 36.48 sec initially (pt kept pulling pegs back out  after placing d/t ? Sweaty fingers), 2nd trial = 28.68 sec; Left: 25.04 sec  SENSATION: WFL  EDEMA: very mild Rt hand  COGNITION: Overall cognitive status: pt with max difficulty spelling world backwards/decreased processing and significantly extra time, self corrected error. Pt  also demo some mild difficulty following directions, but could also be due to [facility]. 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 Pt required increased time and cues for proper completion. Pt demonstrating difficulties with depth perception and recommended practice with placement of pennies in piggy bank or toothpicks into straw positioned in a cup.   - Self-care/home management completed for duration as noted below including: OT educated pt on key sleep hygiene strategies as noted in pt instructions, including: Maintaining a consistent sleep/wake schedule Creating a quiet, dark, and cool sleep environment Avoiding screens and stimulants before bedtime Making sleep environment comfortable Managing food and fluid intake to reduce nighttime awakenings Getting regular physical activity/limiting naps Reducing bad sleep habits while incorporating better sleep  habits Tips for relaxation When to seek additional help Discussed brain injury-specific considerations (e.g., increased sleep needs, safety with nighttime mobility)   PATIENT EDUCATION: Education details: SEE ABOVE Person educated: Patient and Spouse Education method: Explanation, Demonstration, Verbal cues, and Handouts Education comprehension: verbalized understanding, returned demonstration, and verbal cues required  HOME EXERCISE PROGRAM: 03/17/24: High level coordination HEP, visual scanning strategies and activities 04/01/2024: golf solitaire 04/16/2024: Sleep hygiene  GOALS: Goals reviewed with patient? Yes  SHORT TERM GOALS: Target date: 04/03/24  Pt to verbalize understanding with visual scanning strategies and activities to encourage visual scanning Baseline: Goal status: IN PROGRESS  2.  Pt independent with high level coordination HEP  Baseline:  Goal status: IN PROGRESS  3.  Pt to perform tabletop scanning at 90% accuracy Baseline:  Goal status: IN PROGRESS   LONG TERM GOALS: Target date: 04/24/24  Pt to perform environmental scanning at 85% accuracy or higher with simple physical task Baseline:  Goal status:  IN PROGRESS  2.  Pt to sufficiently type for work related tasks including entering/translating correct info on the computer Baseline:  Goal status: INITIAL  3.  Pt to demo ability to perform problem solving task for work simulation w/ task modifications/external cues prn with no verbal cues Baseline:  Goal status: INITIAL  4.  Improve coordination as evidenced by performing 9 hole peg test in 25 sec or less Baseline: b/t 28 and 36 sec.  Goal status: INITIAL   ASSESSMENT:  CLINICAL IMPRESSION: Pt verbalizes and demonstrates good understanding of activities and education as needed to progress towards goals though requires increased time for processing. Will assess progression towards goals to determine d/c vs extension.  PERFORMANCE DEFICITS: in  functional skills including IADLs, coordination, Fine motor control, decreased knowledge of use of DME, and vision, cognitive skills including memory, perception, problem solving, and safety awareness.   IMPAIRMENTS: are limiting patient from IADLs, work, and leisure.   CO-MORBIDITIES: may have co-morbidities  that affects occupational performance. Patient will benefit from skilled OT to address above impairments and improve overall function.  EVALUATION COMPLEXITY: Moderate  PLAN:  OT FREQUENCY: 2x/week  OT DURATION: 6 weeks  PLANNED INTERVENTIONS: 97535 self care/ADL training, 02889 therapeutic exercise, 97530 therapeutic activity, 97112 neuromuscular re-education, 97129 Cognitive training (first 15 min), 02869 Cognitive training(each additional 15 min), visual/perceptual remediation/compensation, patient/family education, and DME and/or AE instructions  RECOMMENDED OTHER SERVICES: Speech therapy services  CONSULTED AND AGREED WITH PLAN OF CARE: Patient and family member/caregiver  PLAN FOR NEXT SESSION: d/c vs extension  symbol digits worksheet, transposing written things from book/magazine to computer  Jocelyn CHRISTELLA Bottom, OT 04/16/2024, 1:44 PM

## 2024-04-16 NOTE — Patient Instructions (Signed)
??   Sleep Hygiene Handout ?? What Is Sleep Hygiene? Sleep hygiene refers to healthy habits and practices that help improve the quality, duration, and consistency of your sleep. Good sleep hygiene supports mental, emotional, and physical health.  ? Tips for Better Sleep Hygiene 1. Stick to a Consistent Sleep Schedule Go to bed and wake up at the same time every day--even on weekends. Helps regulate your body's internal clock. 2. Create a Relaxing Bedtime Routine Try calming activities before bed (e.g., reading, gentle stretching, meditation). Avoid stressful conversations or work tasks right before sleep. Take a bath or shower. 3. Limit Exposure to Light at Night Dim the lights in the evening. Avoid screens (phones, TVs, computers) at least 1 hour before bed. Use blue light filters if necessary. 4. Make Your Sleep Environment Comfortable Keep your bedroom dark, quiet, and cool  Use blackout curtains, white noise machines, or earplugs if needed. Invest in a comfortable mattress and pillows. Change your bedsheets and make your bed Practice proper sleep positioning Incorporate smells that you enjoy/help you relax like lavender, peppermint, etc. (lotion, bath/beauty products, essential oils/diffuser) 5. Watch What You Eat & Drink Avoid large meals, caffeine, and alcohol close to bedtime. Try to finish eating and drinking fluids at least 2-3 hours before bed.   6. Get Regular Physical Activity Exercise during the day can help you fall asleep faster and enjoy deeper sleep. (complete therapy exercises, walking, go to the gym) Avoid intense workouts late in the evening. 7. Limit Naps Keep naps short (20-30 minutes). Avoid napping late in the afternoon or evening.  ?? Habits That Interfere with Sleep Using your bed for work, watching TV, or eating. Staying in bed when you can't fall asleep (if you're awake for 20+ minutes, get up and do something relaxing in dim light).  ??? Bonus Tips  for Relaxation Try breathing exercises, progressive muscle relaxation, or guided meditation. Apps like Calm, Headspace, or Insight Timer can be helpful.  ?? When to Seek Help Consider talking to a doctor or sleep specialist if: You regularly have trouble falling or staying asleep. You snore loudly or gasp for air during sleep. You feel very sleepy during the day despite adequate sleep.  After a brain injury, sleep becomes especially important for brain recovery and overall health. Here's what the experts recommend: General Recommendation: Most adults are advised to get 7-9 hours of sleep per night. Post-Brain Injury Needs: These individuals often need more sleep than they did before injury, especially during the early stages of recovery.

## 2024-04-21 ENCOUNTER — Ambulatory Visit: Admitting: Speech Pathology

## 2024-04-21 ENCOUNTER — Ambulatory Visit: Admitting: Occupational Therapy

## 2024-04-21 DIAGNOSIS — R41842 Visuospatial deficit: Secondary | ICD-10-CM

## 2024-04-21 DIAGNOSIS — I69118 Other symptoms and signs involving cognitive functions following nontraumatic intracerebral hemorrhage: Secondary | ICD-10-CM

## 2024-04-21 DIAGNOSIS — R480 Dyslexia and alexia: Secondary | ICD-10-CM

## 2024-04-21 DIAGNOSIS — M6281 Muscle weakness (generalized): Secondary | ICD-10-CM

## 2024-04-21 DIAGNOSIS — R278 Other lack of coordination: Secondary | ICD-10-CM

## 2024-04-21 DIAGNOSIS — R41841 Cognitive communication deficit: Secondary | ICD-10-CM

## 2024-04-21 NOTE — Therapy (Unsigned)
 OUTPATIENT OCCUPATIONAL THERAPY NEURO TREATMENT  Patient Name: Dakota Flores MRN: [id number] DOB:October 26, 1960, 63 y.o., male Today's Date: 04/21/2024  PCP: Cristopher Bottcher, NP REFERRING PROVIDER: Valora Tawni BIRCH, PA-C  END OF SESSION:  OT End of Session - 04/21/24 1017     Visit Number 6    Number of Visits 19    Date for OT Re-Evaluation 06/13/24    Authorization Type BC/BS - 25 VL combined (used 4), no auth required    Authorization - Number of Visits 21    OT Start Time 1019    OT Stop Time 1058    OT Time Calculation (min) 39 min    Activity Tolerance Patient tolerated treatment well    Behavior During Therapy WFL for tasks assessed/performed         Past Medical History:  Diagnosis Date   Depression    Past Surgical History:  Procedure Laterality Date   COLONOSCOPY  08/2017   LASIK  2005   VASECTOMY     WISDOM TOOTH EXTRACTION     There are no active problems to display for this patient.  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 He did not practice placing toothpicks into a straw or coins into a bank.   Pt accompanied by: self  PERTINENT HISTORY:  63 y.o. 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 Yes: NPRS scale: 3/10 Pain location: L shoulder and neck Pain description: tightness Aggravating factors: increases over night Relieving factors: stretches  FALLS: Has patient fallen in last 6 months? No  LIVING ENVIRONMENT: Lives with: lives with their spouse Lives in: 1 level house, 3 steps to enter from front Has following equipment at home: None  PLOF: Independent, Leisure: tennis, home improvement, and works as Pensions consultant for [employer]  PATIENT GOALS: return to normal vision and reading   OBJECTIVE:  Note: Objective measures were completed at Evaluation unless otherwise noted.  HAND DOMINANCE: Right  ADLs: Overall ADLs: independent Transfers/ambulation related to ADLs: independent Eating: independent Grooming: mod I  UB Dressing: independent LB Dressing: independent Toileting: independent Bathing: mod I  Tub Shower transfers: mod I  Equipment: none  IADLs: Shopping: has not yet done Light housekeeping: pt has returned to light cleaning Meal Prep: wife has always done Community mobility: currently unable to drive Medication management: independent Financial management: not yet attempted since d/c from hospital  Handwriting: no changes  MOBILITY STATUS: Independent   UPPER EXTREMITY ROM:  BUE AROM WNLs  UPPER EXTREMITY MMT:   BUE MMT grossly 5/5  HAND FUNCTION: Grip strength: Right: 103.1 lbs; Left: 105.3 lbs  COORDINATION: 9 Hole Peg test: Right: 36.48 sec initially (pt kept pulling pegs back out after placing d/t ? Sweaty  fingers), 2nd trial = 28.68 sec; Left: 25.04 sec  SENSATION: WFL  EDEMA: very mild Rt hand  COGNITION: Overall cognitive status: pt with max difficulty spelling world backwards/decreased processing and significantly extra time, self corrected error. Pt also demo some mild difficulty following  directions, but could also be due to [facility]. 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 Pt completed table top scanning activity with 97% accuracy as noted below requiring increased time and visual rest break following completion.   Objective measures assessed as noted in Goals section to determine progression towards goals. Therapist reviewed goals with patient and updated patient progression.  No additional functional limitations identified.  PATIENT EDUCATION: Education details: SEE ABOVE Person educated: Patient Education method: Programmer, multimedia, Demonstration, Verbal cues, and Handouts Education comprehension: verbalized understanding, returned demonstration, and verbal cues required  HOME EXERCISE PROGRAM: 03/17/24: High level coordination HEP, visual scanning strategies and activities 04/01/2024: golf  solitaire 04/16/2024: Sleep hygiene  GOALS: Goals reviewed with patient? Yes  SHORT TERM GOALS: Target date: 04/03/24  Pt to verbalize understanding with visual scanning strategies and activities to encourage visual scanning Baseline: Goal status: MET  2.  Pt independent with high level coordination HEP  Baseline:  04/21/2024: has not been completing Goal status: IN PROGRESS  3.  Pt to perform tabletop scanning at 90% accuracy Baseline:  04/21/2024: 97% accuracy Goal status: MET   LONG TERM GOALS: Target date: 06/13/2024    Pt to perform environmental scanning at 85% accuracy or higher with simple physical task Baseline:  Goal status: IN PROGRESS  2.  Pt to sufficiently type for work related tasks including entering/translating correct info on the computer Baseline: used Dragon the last 5+ years Goal status: IN PROGRESS   3.  Pt to demo ability to perform problem solving task for work simulation w/ task modifications/external cues prn with no verbal cues Baseline:  Goal status: INITIAL  4.  Improve coordination as evidenced by  performing 9 hole peg test in 25 sec or less Baseline: b/t 28 and 36 sec.  04/21/2024: 26 seconds bilaterally Goal status: IN PROGRESS   ASSESSMENT:  CLINICAL IMPRESSION: Pt verbalizes and demonstrates good understanding of activities and education as needed to progress towards goals though requires increased time for processing. Recommend extending services to allow pt to reach max rehab potential. Slower progression towards goals as pt had reduced initial frequency from 2xweek to once a week.  PERFORMANCE DEFICITS: in functional skills including IADLs, coordination, Fine motor control, decreased knowledge of use of DME, and vision, cognitive skills including memory, perception, problem solving, and safety awareness.   IMPAIRMENTS: are limiting patient from IADLs, work, and leisure.   CO-MORBIDITIES: may have co-morbidities  that affects  occupational performance. Patient will benefit from skilled OT to address above impairments and improve overall function.  EVALUATION COMPLEXITY: Moderate  PLAN:  OT FREQUENCY: 2x/week  OT DURATION: 6 weeks  PLANNED INTERVENTIONS: 97535 self care/ADL training, 02889 therapeutic exercise, 97530 therapeutic activity, 97112 neuromuscular re-education, [id number] Cognitive training (first 15 min), 02869 Cognitive training(each additional 15 min), visual/perceptual remediation/compensation, patient/family education, and DME and/or AE instructions  RECOMMENDED OTHER SERVICES: Speech therapy services  CONSULTED AND AGREED WITH PLAN OF CARE: Patient and family member/caregiver  PLAN FOR NEXT SESSION: progress towards remaining goals. symbol digits worksheet, transposing written things from book/magazine to computer  Jocelyn CHRISTELLA Bottom, OT 04/21/2024, 1:16 PM

## 2024-04-21 NOTE — Therapy (Signed)
 OUTPATIENT SPEECH LANGUAGE PATHOLOGY TREATMENT   Patient Name: Dakota Flores MRN: [id number] DOB:Jun 04, 1961, 63 y.o., male Today's Date: 04/21/2024  PCP: Cristopher Bottcher, NP REFERRING PROVIDER: Cristopher Bottcher, NP  END OF SESSION:  End of Session - 04/21/24 0848     Visit Number 7    Number of Visits 9    Date for SLP Re-Evaluation 05/04/24    Authorization Type 25 total for all disciplines    Authorization - Number of Visits 25    SLP Start Time 0845    SLP Stop Time  0930    SLP Time Calculation (min) 45 min    Activity Tolerance Patient tolerated treatment well           Past Medical History:  Diagnosis Date   Depression    Past Surgical History:  Procedure Laterality Date   COLONOSCOPY  08/2017   LASIK  2005   VASECTOMY     WISDOM TOOTH EXTRACTION     There are no active problems to display for this patient.   ONSET DATE: 02/16/24   REFERRING DIAG: R41.89 (ICD-10-CM) - Other symptoms and signs involving cognitive functions and awareness  THERAPY DIAG:  Cognitive communication deficit  Dyslexia and alexia  Rationale for Evaluation and Treatment: Rehabilitation  SUBJECTIVE:   SUBJECTIVE STATEMENT: Pt reports improving vision regarding complaints from last session.  PERTINENT HISTORY: 63 y.o. 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 No  FALLS: Has patient fallen in last 6 months?  See PT evaluation for details  LIVING ENVIRONMENT: Lives with: lives with their spouse Lives in: House/apartment  PLOF:  Level of assistance: Independent with ADLs, Independent with IADLs Employment: Data processing manager for  [employer]  PATIENT GOALS: To return to work if able  OBJECTIVE:  Note: Objective measures were completed at Evaluation unless otherwise noted.   COGNITION: Overall cognitive status: Within functional limits for tasks assessed Areas of impairment:   Functional deficits: reading comprehension/processing  COGNITIVE COMMUNICATION: Following directions: Follows multi-step commands consistently  Auditory comprehension: WFL Verbal expression: WFL Functional communication: Impaired: unable to read/comprehend texts and e mails  ORAL MOTOR EXAMINATION: Overall status: WFL Comments:   STANDARDIZED ASSESSMENTS: Subtests of RCBA: Word-Visual - 8/10 - extended time consistently Word-Semantic 8/10 - extended time consistently Sentence-Picture 0/3 - not completed due to extended time   PATIENT REPORTED OUTCOME MEASURES (PROM): Cognitive Function: 133/140 - all items a 5 or no difficulty except a 2 (a lot of difficulty_) reading complex instructions and a 1,very often difficulty reading something several times to understand it  TREATMENT DATE:   04/21/24: Target decoding and reading comprehension via definition and word matching task featuring tax law related terms. Pt completed x10 terms with rare decoding errors, no evidence of comprehension error. Appears to have improved reading fluency but visual deficits continue to inhibit return to baseline reading capabilities.   04/14/24: Target decoding and reading comprehension via hierarchical tasks. For short sentence analogy completion, pt with occasional errors with accuracy of decoing (~5%), corrects with rare verbal cue, x1 instance of needing re-written to aid in accurate decoding. Completes analogy correctly 24/25 opportunities, with rare verbal cues needed, indicating intact comprehension. In reading short paragraphs (4  sentences) and generating appropriate solution, pt with mildly worse fluency noted vs sentence level task, rare decoding errors, 100% success with generating solution. Seems to have increased challenge with increased information vs potential visual fatigue factor. Will trial longer stimuli first next session.   04/07/24: Target decoding and reading comprehension via paragraph reading of increasing lengths. No evidence of visual neglect impacting reading abilities this date. 2 sentence paragraphs read with minor errors for reading fluency, answers comprehension questions with 90% accuracy. 4 sentence paragraph, pt used visual aid for visual orientation. Had to re-read to increase fluency and accuracy of reading. Answers comprehension questions with 100% accuracy.   04/01/24: Targeted reading comprehension in simple to moderately complex sentence unscramble - With extended time and occasional min verbal cues, he successfully reading and decoded sentences. Unscramble words with known topic with rare min A. Reading comprehension targeted in reading and ordering 4 step social tasks with extended time and rare min A.   03/24/24: SLP assisted pt in modifying accessibility features on phone. Pt able to demonstrate with min-A verbal cues reading of text messages. Discussed variety of HEP activities to support return to reading. Addressed reading comprehension of mod complex words with atypical spellings with pt achieving 100% accuracy given extra time and occasional min-A. Pt generates antonym in 100% of opportunities demonstrating intact written skills. Demonstrated strategies for decoding including syllabification, written support d/t intact written expression.   03/18/24: Assessed comprehension of numbers - 10/10 accurate. Targeted reading a word level identifying synonyms out of field of 3 with extended time and usual min verbal cues to ID and correct errors. Scanning page with extended time and mod I. Targeted  reading comprehension at word level, identifying and spelling words with fill in the blanks with usual min verbal cues. Spelling with mod I. He denies eye fatigue or strain  03/10/23: Eval only due to time constraints    PATIENT EDUCATION: Education details: areas of impairment, goals Person educated: Patient and Spouse Education method: Explanation and Verbal cues Education comprehension: needs further education   GOALS: Goals reviewed with patient? Yes  SHORT TERM GOALS: Target date: 04/20/24  Pt will scan left to right to see 3-4 words on page with rare min A Baseline: 04/07/24, 04/14/24 Goal status: MET  2.  Pt will ID word f:4 with extended time 4/5x  Baseline:  Goal status: MET  3.  Pt will read and comprehend 5 -7 word text with rare min A and extended time Baseline: 04/14/24 Goal status: MET  4.  Pt will generate and edit 3 sentence email with occasional min A Baseline:  Goal status: ONGOING  LONG TERM GOALS: Target date: 05/05/24  Pt will read and comprehend 3-4 sentence paragraph with occasional min A Baseline:  Goal status: ONGOING  2.  Pt will independently scan reading material and self correct with occasional  min A Baseline:  Goal status: ONGOING  3.  Pt will generate and correct 5 sentence document with occasional min A Baseline:  Goal status: ONGOING  4.  Pt will carryover 2 compensations for reading comprehension for emails and work (text to speech, spech to text) with mod I Baseline:  Goal status: ONGOING   ASSESSMENT:  CLINICAL IMPRESSION: Patient is a 63 y.o. male who was seen today for cognitive communication assessment. 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 Despite being able to manipulate large print words, [name] demonstrates impaired comprehension at word level. Unable to comprehend sentence levels despite compensations for visual impairment. [name] reports difficulty understanding texts and emails. He wrote  simple to moderately complex sentences to dictation accurately. They both deny any other cognitive changes - [name] is completing house hold tasks, instructing [name] on financial management, managing the remote control, microwave with success. They both deny any word finding difficulties and report he passes his naming test in the hospital. I recommend skilled ST to maximize reading comprehension for independence, safety and QOL.    OBJECTIVE IMPAIRMENTS: include receptive language and reading comprehension. These impairments are limiting patient from return to work, managing finances, and effectively communicating at home and in community. Factors affecting potential to achieve goals and functional outcome are n/a.SABRA Patient will benefit from skilled SLP services to address above impairments and improve overall function.  REHAB POTENTIAL: Good  PLAN:  SLP FREQUENCY: 1-2x/week  SLP DURATION: 8 weeks  PLANNED INTERVENTIONS: Language facilitation, Environmental controls, Cueing hierachy, Cognitive reorganization, Internal/external aids, Functional tasks, Multimodal communication approach, SLP instruction and feedback, Compensatory strategies, Patient/family education, [phone] Treatment of speech (30 or 45 min) , and [id number]- Speech Eval Sound Prod, Articulate, Phonological    Harlene LITTIE Ned, CCC-SLP 04/21/2024, 8:49 AM

## 2024-04-23 ENCOUNTER — Encounter: Admitting: Speech Pathology

## 2024-04-30 ENCOUNTER — Encounter: Admitting: Speech Pathology

## 2024-05-01 ENCOUNTER — Encounter: Admitting: Speech Pathology

## 2024-05-05 ENCOUNTER — Encounter: Payer: Self-pay | Admitting: Speech Pathology

## 2024-05-05 ENCOUNTER — Ambulatory Visit: Attending: Physician Assistant | Admitting: Speech Pathology

## 2024-05-05 DIAGNOSIS — M6281 Muscle weakness (generalized): Secondary | ICD-10-CM | POA: Insufficient documentation

## 2024-05-05 DIAGNOSIS — R278 Other lack of coordination: Secondary | ICD-10-CM | POA: Diagnosis not present

## 2024-05-05 DIAGNOSIS — R480 Dyslexia and alexia: Secondary | ICD-10-CM | POA: Insufficient documentation

## 2024-05-05 DIAGNOSIS — I69118 Other symptoms and signs involving cognitive functions following nontraumatic intracerebral hemorrhage: Secondary | ICD-10-CM | POA: Insufficient documentation

## 2024-05-05 DIAGNOSIS — R41842 Visuospatial deficit: Secondary | ICD-10-CM | POA: Insufficient documentation

## 2024-05-05 NOTE — Therapy (Signed)
 OUTPATIENT SPEECH LANGUAGE PATHOLOGY TREATMENT & RE-CERTIFICATION   Patient Name: Dakota Flores MRN: [id number] DOB:[birth year], 63 y.o., male Today's Date: 05/05/2024  PCP: Cristopher Bottcher, NP REFERRING PROVIDER: Cristopher Bottcher, NP  END OF SESSION:  End of Session - 05/05/24 0900     Visit Number 8    Number of Visits 13    Date for SLP Re-Evaluation 06/16/24   recert   Authorization Type 25 total for all disciplines    Authorization - Number of Visits 25    SLP Start Time [phone]    SLP Stop Time  0930    SLP Time Calculation (min) 40 min    Activity Tolerance Patient tolerated treatment well           Past Medical History:  Diagnosis Date   Depression    Past Surgical History:  Procedure Laterality Date   COLONOSCOPY  08/2017   LASIK  2005   VASECTOMY     WISDOM TOOTH EXTRACTION     There are no active problems to display for this patient.   ONSET DATE: 02/16/24   REFERRING DIAG: R41.89 (ICD-10-CM) - Other symptoms and signs involving cognitive functions and awareness  THERAPY DIAG:  Dyslexia and alexia  Rationale for Evaluation and Treatment: Rehabilitation  SUBJECTIVE:   SUBJECTIVE STATEMENT: Pt reports improving vision regarding complaints from last session.  PERTINENT HISTORY: 63 y.o. 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 No  FALLS: Has patient fallen in last 6 months?  See PT evaluation for details  LIVING ENVIRONMENT: Lives with: lives with their spouse Lives in: House/apartment  PLOF:  Level of assistance: Independent with ADLs, Independent with IADLs Employment: Data processing manager for  [employer]  PATIENT GOALS: To return to work if able  OBJECTIVE:  Note: Objective measures were completed at Evaluation unless otherwise noted.   COGNITION: Overall cognitive status: Within functional limits for tasks assessed Areas of impairment:   Functional deficits: reading comprehension/processing  COGNITIVE COMMUNICATION: Following directions: Follows multi-step commands consistently  Auditory comprehension: WFL Verbal expression: WFL Functional communication: Impaired: unable to read/comprehend texts and e mails  ORAL MOTOR EXAMINATION: Overall status: WFL Comments:   STANDARDIZED ASSESSMENTS: Subtests of RCBA: Word-Visual - 8/10 - extended time consistently Word-Semantic 8/10 - extended time consistently Sentence-Picture 0/3 - not completed due to extended time   PATIENT REPORTED OUTCOME MEASURES (PROM): Cognitive Function: 133/140 - all items a 5 or no difficulty except a 2 (a lot of difficulty_) reading complex instructions and a 1,very often difficulty reading something several times to understand it  TREATMENT DATE:   05/05/24: Targeted reading comprehension at 4-5 sentence paragraphs using visual guide (book mark) and consistent extended time to process and comprehend - visual impairment continues to affect reading as well. [name] read and comprehended 5 paragraphs with varying and unfamiliar topics. He consistently corrects errors by re-scanning. [name] is reading simple texts with mod I/extended time. He is using text to speech to compensate for dyslexia on longer texts and emails.   04/21/24: Target decoding and reading comprehension via definition and word matching task featuring tax law related terms. Pt completed x10 terms with rare decoding errors, no evidence of comprehension error. Appears to have improved reading fluency but visual deficits  continue to inhibit return to baseline reading capabilities.   04/14/24: Target decoding and reading comprehension via hierarchical tasks. For short sentence analogy completion, pt with occasional errors with accuracy of decoing (~5%), corrects with rare verbal cue, x1 instance of needing re-written to aid in accurate decoding. Completes analogy correctly 24/25 opportunities, with rare verbal cues needed, indicating intact comprehension. In reading short paragraphs (4 sentences) and generating appropriate solution, pt with mildly worse fluency noted vs sentence level task, rare decoding errors, 100% success with generating solution. Seems to have increased challenge with increased information vs potential visual fatigue factor. Will trial longer stimuli first next session.   04/07/24: Target decoding and reading comprehension via paragraph reading of increasing lengths. No evidence of visual neglect impacting reading abilities this date. 2 sentence paragraphs read with minor errors for reading fluency, answers comprehension questions with 90% accuracy. 4 sentence paragraph, pt used visual aid for visual orientation. Had to re-read to increase fluency and accuracy of reading. Answers comprehension questions with 100% accuracy.   04/01/24: Targeted reading comprehension in simple to moderately complex sentence unscramble - With extended time and occasional min verbal cues, he successfully reading and decoded sentences. Unscramble words with known topic with rare min A. Reading comprehension targeted in reading and ordering 4 step social tasks with extended time and rare min A.   03/24/24: SLP assisted pt in modifying accessibility features on phone. Pt able to demonstrate with min-A verbal cues reading of text messages. Discussed variety of HEP activities to support return to reading. Addressed reading comprehension of mod complex words with atypical spellings with pt achieving 100% accuracy given extra time and  occasional min-A. Pt generates antonym in 100% of opportunities demonstrating intact written skills. Demonstrated strategies for decoding including syllabification, written support d/t intact written expression.   03/18/24: Assessed comprehension of numbers - 10/10 accurate. Targeted reading a word level identifying synonyms out of field of 3 with extended time and usual min verbal cues to ID and correct errors. Scanning page with extended time and mod I. Targeted reading comprehension at word level, identifying and spelling words with fill in the blanks with usual min verbal cues. Spelling with mod I. He denies eye fatigue or strain  03/10/23: Eval only due to time constraints    PATIENT EDUCATION: Education details: areas of impairment, goals Person educated: Patient and Spouse Education method: Explanation and Verbal cues Education comprehension: needs further education   GOALS: Goals reviewed with patient? Yes  SHORT TERM GOALS: Target date: 04/20/24  Pt will scan left to right to see 3-4 words on page with rare min A Baseline: 04/07/24, 04/14/24 Goal status: MET  2.  Pt will ID word f:4 with extended time 4/5x  Baseline:  Goal status: MET  3.  Pt will read and comprehend 5 -7  word text with rare min A and extended time Baseline: 04/14/24 Goal status: MET  4.  Pt will generate and edit 3 sentence email with occasional min A Baseline:  Goal status: NOT MET  LONG TERM GOALS: Target date: 06/16/24  Pt will read and comprehend 3-4 sentence paragraph with occasional min A Baseline:  Goal status: MET  2.  Pt will independently scan reading material and self correct with occasional min A Baseline:  Goal status: MET  3.  Pt will generate and correct 5 sentence document with occasional min A Baseline:  Goal status: ONGOING  4.  Pt will carryover 2 compensations for reading comprehension for emails and work (text to speech, spech to text) with mod I Baseline:  Goal status:  MET  5. Pt will carryover compensations for dyslexia and visual impairments to comprehend 5-6 sentence paragraph with rare min A          Goal Status: NEW   ASSESSMENT:  CLINICAL IMPRESSION: Patient is a 63 y.o. male who was seen today for cognitive communication assessment. He has progressed form moderate acquired alexia with visual impairment to mild to moderate acquired alexia. SABRA He has improved form  impaired comprehension at word level to slow comprehension at simple 3-4 sentence paragraph level.  [name] is now able to read and comprehend simple texts and emails, however he has ongoing difficulty with longer more complex texts. [name] is using text to speech on his phone to compensate for dyslexia and visual impairment when he needs to read longer texts. SABRA He continues to  deny any other cognitive changes - Goals updated and added due to good progress. . I recommend skilled ST to maximize reading comprehension for independence, safety and QOL.  Re-cert completed 05/05/24.   OBJECTIVE IMPAIRMENTS: include receptive language and reading comprehension. These impairments are limiting patient from return to work, managing finances, and effectively communicating at home and in community. Factors affecting potential to achieve goals and functional outcome are n/a.SABRA Patient will benefit from skilled SLP services to address above impairments and improve overall function.  REHAB POTENTIAL: Good  PLAN:  SLP FREQUENCY: 1-2x/week  SLP DURATION: 12 weeks  PLANNED INTERVENTIONS: Language facilitation, Environmental controls, Cueing hierachy, Cognitive reorganization, Internal/external aids, Functional tasks, Multimodal communication approach, SLP instruction and feedback, Compensatory strategies, Patient/family education, [phone] Treatment of speech (30 or 45 min) , and [id number]- Speech Eval Sound Prod, Articulate, Phonological    Tilmon Wisehart, Leita Caldron, CCC-SLP 05/05/2024, 9:47 AM

## 2024-05-06 ENCOUNTER — Ambulatory Visit: Payer: Self-pay | Admitting: Occupational Therapy

## 2024-05-06 DIAGNOSIS — I69118 Other symptoms and signs involving cognitive functions following nontraumatic intracerebral hemorrhage: Secondary | ICD-10-CM

## 2024-05-06 DIAGNOSIS — R278 Other lack of coordination: Secondary | ICD-10-CM | POA: Diagnosis not present

## 2024-05-06 DIAGNOSIS — R41842 Visuospatial deficit: Secondary | ICD-10-CM | POA: Diagnosis not present

## 2024-05-06 DIAGNOSIS — R480 Dyslexia and alexia: Secondary | ICD-10-CM | POA: Diagnosis not present

## 2024-05-06 DIAGNOSIS — M6281 Muscle weakness (generalized): Secondary | ICD-10-CM | POA: Diagnosis not present

## 2024-05-06 NOTE — Therapy (Signed)
 OUTPATIENT OCCUPATIONAL THERAPY NEURO TREATMENT  Patient Name: Dakota Flores MRN: [id number] DOB:03/18/1961, 63 y.o., male Today's Date: 05/06/2024  PCP: Cristopher Bottcher, NP REFERRING PROVIDER: Valora Tawni BIRCH, PA-C  END OF SESSION:  OT End of Session - 05/06/24 0931     Visit Number 7    Number of Visits 19    Date for OT Re-Evaluation 06/13/24    Authorization Type BC/BS - 25 VL combined (used 4), no auth required    Authorization - Number of Visits 21    OT Start Time [phone]    OT Stop Time 1018    OT Time Calculation (min) 47 min    Activity Tolerance Patient tolerated treatment well    Behavior During Therapy [facility] for tasks assessed/performed         Past Medical History:  Diagnosis Date   Depression    Past Surgical History:  Procedure Laterality Date   COLONOSCOPY  08/2017   LASIK  2005   VASECTOMY     WISDOM TOOTH EXTRACTION     There are no active problems to display for this patient.  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   Pt accompanied by: significant other  PERTINENT HISTORY:  63 y.o. 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 Yes: NPRS scale: 3/10 Pain location: L shoulder and neck Pain description: tightness Aggravating factors: increases over night Relieving factors: stretches  FALLS: Has patient fallen in last 6 months? No  LIVING ENVIRONMENT: Lives with: lives with their spouse Lives in: 1 level house, 3 steps to enter from front Has following equipment at home: None  PLOF: Independent, Leisure: tennis, home improvement, and works as Pensions consultant for [employer]  PATIENT GOALS: return to normal vision and reading   OBJECTIVE:  Note: Objective measures were completed at Evaluation unless otherwise noted.  HAND DOMINANCE: Right  ADLs: Overall ADLs: independent Transfers/ambulation related to ADLs: independent Eating: independent Grooming: mod I  UB Dressing: independent LB Dressing: independent Toileting: independent Bathing: mod I  Tub Shower transfers: mod I  Equipment: none  IADLs: Shopping: has not yet done Light housekeeping: pt has returned to light cleaning Meal Prep: wife has always done Community mobility: currently unable to drive Medication management: independent Financial management: not yet attempted since d/c from hospital  Handwriting: no changes  MOBILITY STATUS: Independent   UPPER EXTREMITY ROM:  BUE AROM WNLs  UPPER EXTREMITY MMT:   BUE MMT grossly 5/5  HAND FUNCTION: Grip strength: Right: 103.1 lbs; Left: 105.3 lbs  COORDINATION: 9 Hole Peg test: Right: 36.48 sec initially (pt kept pulling pegs back out after placing d/t ? Sweaty fingers), 2nd trial = 28.68 sec;  Left: 25.04 sec  SENSATION: WFL  EDEMA: very mild Rt hand  COGNITION: Overall cognitive status: pt with max difficulty spelling world backwards/decreased processing and significantly extra time, self corrected error. Pt also demo some mild difficulty following directions, but  could also be due to [facility]. 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  Patient has difficulty with following activities due to following visual impairments: reading  PERCEPTION: Not tested  PRAXIS: Not tested  OBSERVATIONS: significant Rt homonymous hemianopsia to midline, higher level cognitive deficits                                                                                                                           TREATMENT:  Therapeutic activities completed for duration as noted below including: -Pt engaged in a visual scanning activity by using fine motor skills to sort through different colored pegs, picking them up and sorting them by color in the peg board. Pt required minimal cueing for sequencing of instructions of task. -Pt participated in 4 games of Uzzle at level(s) 1, 2, and 3 using RUE and LUE for eye-hand coordination and cognition by matching game pieces to visual patterns. Pt demonstrating ability to solve level(s) 1, 2, and 3.  -Pt assembled 7 Pieces of [place] for BUE coordination and visuospatial relations.    PATIENT EDUCATION: Education details:Incorporate visual scanning strategies into daily activities  Person educated: Patient and Spouse Education method: Explanation, Demonstration, and Verbal cues Education comprehension: verbalized understanding, returned demonstration, and verbal cues required  HOME EXERCISE PROGRAM: 03/17/24: High level  coordination HEP, visual scanning strategies and activities 04/01/2024: golf solitaire 04/16/2024: Sleep hygiene  GOALS: Goals reviewed with patient? Yes  SHORT TERM GOALS: Target date: 04/03/24  Pt to verbalize understanding with visual scanning strategies and activities to encourage visual scanning Baseline: Goal status: MET  2.  Pt independent with high level coordination HEP  Baseline:  04/21/2024: has not been completing Goal status: IN PROGRESS  3.  Pt to perform tabletop scanning at 90% accuracy Baseline:  04/21/2024: 97% accuracy Goal status: MET   LONG TERM GOALS: Target date: 06/13/2024    Pt to perform environmental scanning at 85% accuracy or higher with simple physical task Baseline:  Goal status: IN PROGRESS  2.  Pt to sufficiently type for work related tasks including entering/translating correct info on the computer Baseline: used Dragon the last 5+ years Goal status: IN PROGRESS   3.  Pt to demo ability to perform problem solving task for work simulation  w/ task modifications/external cues prn with no verbal cues Baseline:  Goal status: IN PROGRESS  4.  Improve coordination as evidenced by performing 9 hole peg test in 25 sec or less Baseline: b/t 28 and 36 sec.  04/21/2024: 26 seconds bilaterally Goal status: IN PROGRESS   ASSESSMENT:  CLINICAL IMPRESSION: Pt presents with visual scanning deficits secondary to cerebral aneurysm. While he is showing slower progression towards goals, he demonstrates good rehab potential as evidence to his ability to visually scan during table top activities with minimal verbal/tactile cueing and 97% accuracy. He will continue to benefit from skilled outpatient OT to improve functional independence in ADLs and IADLs.    PERFORMANCE DEFICITS: in functional skills including IADLs, coordination, Fine motor control, decreased knowledge of use of DME, and vision, cognitive skills including memory, perception, problem solving, and  safety awareness.   IMPAIRMENTS: are limiting patient from IADLs, work, and leisure.   CO-MORBIDITIES: may have co-morbidities  that affects occupational performance. Patient will benefit from skilled OT to address above impairments and improve overall function.  EVALUATION COMPLEXITY: Moderate  PLAN:  OT FREQUENCY: 2x/week  OT DURATION: 6 weeks  PLANNED INTERVENTIONS: 97535 self care/ADL training, 02889 therapeutic exercise, 97530 therapeutic activity, 97112 neuromuscular re-education, [id number] Cognitive training (first 15 min), 02869 Cognitive training(each additional 15 min), visual/perceptual remediation/compensation, patient/family education, and DME and/or AE instructions  RECOMMENDED OTHER SERVICES: Speech therapy services  CONSULTED AND AGREED WITH PLAN OF CARE: Patient and family member/caregiver  PLAN FOR NEXT SESSION: progress towards remaining goals. symbol digits worksheet, transposing written things from book/magazine to computer  Kennady Zimmerle, Student-OT 05/06/2024, 11:17 AM

## 2024-05-07 ENCOUNTER — Encounter: Admitting: Speech Pathology

## 2024-05-08 ENCOUNTER — Encounter: Payer: Self-pay | Admitting: Podiatry

## 2024-05-08 ENCOUNTER — Ambulatory Visit: Admitting: Podiatry

## 2024-05-08 VITALS — Ht 70.0 in | Wt 165.0 lb

## 2024-05-08 DIAGNOSIS — M79675 Pain in left toe(s): Secondary | ICD-10-CM

## 2024-05-08 DIAGNOSIS — M79674 Pain in right toe(s): Secondary | ICD-10-CM | POA: Diagnosis not present

## 2024-05-08 DIAGNOSIS — B351 Tinea unguium: Secondary | ICD-10-CM | POA: Diagnosis not present

## 2024-05-08 NOTE — Patient Instructions (Signed)
 Look for urea 40-47% nail gel, cream or ointment and apply to the thickened toenails. This can be bought over the counter, at a pharmacy or online such as [employer].

## 2024-05-08 NOTE — Progress Notes (Signed)
 Chief Complaint  Patient presents with   Nail Problem    Pt here for ongoing nail fungus x 10 years.  Has tried OTC. Took oral meds about 7 years ago helped but didn't get rid of completely.  Would like to discuss permanent removal od bilateral great toe.  Non Diabetic  no anti coag    HPI: 63 y.o. male presents today with chronic bilateral toenail deformity and long-term fungal nail infection.  He is been dealing with it for at least 10 years and has tried various courses of oral prescription, over-the-counter and topical treatments.  Nothing has been able to get rid of it completely.  In addition with being displeased with the cosmetic appearance of the toenails, the bilateral lateral first toenails do cause him irritation.  He would like to discuss some treatment options.  He is interested in removal of the toenails as well, does not think that he can do this today due to his upcoming plans over the next couple of days.  Of note, approximately 3 months ago he did need emergent brain surgery, cerebrovascular dural AV fistula.  He has made great recovery with this.  Past Medical History:  Diagnosis Date   Depression     Past Surgical History:  Procedure Laterality Date   COLONOSCOPY  08/2017   LASIK  2005   VASECTOMY     WISDOM TOOTH EXTRACTION      Allergies  Allergen Reactions   Diclofenac Sodium Dermatitis    ROS denies any nausea, vomiting, fever, chills, chest pain, shortness of breath.   Physical Exam: There were no vitals filed for this visit.  General: The patient is alert and oriented x3 in no acute distress.  Dermatology: Skin is warm, dry and supple bilateral lower extremities. Interspaces are clear of maceration and debris.  Bilateral 1st and 2nd toenails are dystrophic with yellow discoloration, some nail thickening and some brittle appearance.  Bilateral first toenails are tender on direct palpation with irritation around the nail borders.  Vascular:  Palpable pedal pulses bilaterally. Capillary refill within normal limits.  No appreciable edema.  No erythema or calor.  Neurological: Light touch sensation grossly intact bilateral feet.   Musculoskeletal Exam: Muscle strength 5/5 all major muscle groups.  No symptomatic pedal deformities.  No limitations in pedal range of motion.    Assessment/Plan of Care: 1. Pain due to onychomycosis of toenails of both feet      No orders of the defined types were placed in this encounter.  None  Discussed clinical findings with patient today.  Discussed various treatment options.  He has tried multiple oral medications, topical medications, over-the-counter treatments as well.  Bilateral first toenails do cause patient discomfort.  Given this we discussed removal of the bilateral first toenails with chemical matrixectomy.  He is interested in proceeding with this.  He would like to find a later date likely in the next 3 to 4 weeks to have this done.  Regarding the second toenails advise that he try treating with topical 40 to 47% urea-based nail gel 1-2 times a day as this may help improve the appearance of the nail plates over time.   Jamaree Hosier L. Lamount MAUL, AACFAS [facility]     [address]  [place], [place] [address]                Office [phone]  Fax [phone]

## 2024-05-19 ENCOUNTER — Ambulatory Visit: Admitting: Occupational Therapy

## 2024-05-19 DIAGNOSIS — R41842 Visuospatial deficit: Secondary | ICD-10-CM | POA: Diagnosis not present

## 2024-05-19 DIAGNOSIS — I69118 Other symptoms and signs involving cognitive functions following nontraumatic intracerebral hemorrhage: Secondary | ICD-10-CM | POA: Diagnosis not present

## 2024-05-19 DIAGNOSIS — R278 Other lack of coordination: Secondary | ICD-10-CM | POA: Diagnosis not present

## 2024-05-19 DIAGNOSIS — R480 Dyslexia and alexia: Secondary | ICD-10-CM | POA: Diagnosis not present

## 2024-05-19 DIAGNOSIS — M6281 Muscle weakness (generalized): Secondary | ICD-10-CM | POA: Diagnosis not present

## 2024-05-19 NOTE — Therapy (Signed)
 OUTPATIENT OCCUPATIONAL THERAPY NEURO TREATMENT  Patient Name: Dakota Flores MRN: [id number] DOB:12-12-1960, 63 y.o., male Today's Date: 05/19/2024  PCP: Cristopher Bottcher, NP REFERRING PROVIDER: Valora Tawni BIRCH, PA-C  END OF SESSION:  OT End of Session - 05/19/24 0926     Visit Number 8    Number of Visits 19    Date for Recertification  06/13/24    Authorization Type BC/BS - 25 VL combined (used 4), no auth required    Authorization - Number of Visits 21    OT Start Time 0933    OT Stop Time 1020    OT Time Calculation (min) 47 min    Activity Tolerance Patient tolerated treatment well    Behavior During Therapy WFL for tasks assessed/performed         Past Medical History:  Diagnosis Date   Depression    Past Surgical History:  Procedure Laterality Date   COLONOSCOPY  08/2017   LASIK  2005   VASECTOMY     WISDOM TOOTH EXTRACTION     There are no active problems to display for this patient.  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   Pt accompanied by: significant other  PERTINENT HISTORY:  63 y.o. 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 Yes: NPRS scale: 3/10 Pain location: L shoulder and neck Pain description: tightness Aggravating factors: increases over night Relieving factors: stretches  FALLS: Has patient fallen in last 6 months? No  LIVING ENVIRONMENT: Lives with: lives with their spouse Lives in: 1 level house, 3 steps to enter from front Has following equipment at home: None  PLOF: Independent, Leisure: tennis, home improvement, and works as Pensions consultant for [employer]  PATIENT GOALS: return to normal vision and reading   OBJECTIVE:  Note: Objective measures were completed at Evaluation unless otherwise noted.  HAND DOMINANCE: Right  ADLs: Overall ADLs: independent Transfers/ambulation related to ADLs: independent Eating: independent Grooming: mod I  UB Dressing: independent LB Dressing: independent Toileting: independent Bathing: mod I  Tub Shower transfers: mod I  Equipment: none  IADLs: Shopping: has not yet done Light housekeeping: pt has returned to light cleaning Meal Prep: wife has always done Community mobility: currently unable to drive Medication management: independent Financial management: not yet attempted since d/c from hospital  Handwriting: no changes  MOBILITY STATUS: Independent   UPPER EXTREMITY ROM:  BUE AROM WNLs  UPPER EXTREMITY MMT:   BUE MMT grossly 5/5  HAND FUNCTION: Grip strength: Right: 103.1 lbs; Left: 105.3 lbs  COORDINATION: 9 Hole Peg test: Right: 36.48 sec initially (pt kept pulling pegs back out after placing d/t ? Sweaty fingers),  2nd trial = 28.68 sec; Left: 25.04 sec  SENSATION: WFL  EDEMA: very mild Rt hand  COGNITION: Overall cognitive status: pt with max difficulty spelling world backwards/decreased processing and significantly extra time, self corrected error. Pt also demo some mild difficulty  following directions, but could also be due to [facility]. 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  Patient has difficulty with following activities due to following visual impairments: reading  PERCEPTION: Not tested  PRAXIS: Not tested  OBSERVATIONS: significant Rt homonymous hemianopsia to midline, higher level cognitive deficits                                                                                                                           TREATMENT:  Therapeutic activities completed for duration as noted below including: Pt used [place] board with flat marbles and use of RUE and LUE to pick up, store, and place individual marbles for improved strength, in hand manipulation, and fine motor coordination.       PATIENT EDUCATION: Education details:Incorporate visual scanning strategies into daily activities  Person educated: Patient and Spouse Education method: Explanation, Demonstration, and Verbal cues Education comprehension: verbalized understanding, returned demonstration, and verbal cues required  HOME EXERCISE PROGRAM: 03/17/24: High level coordination HEP, visual scanning strategies and activities 04/01/2024: golf solitaire 04/16/2024: Sleep hygiene  GOALS: Goals reviewed with patient? Yes  SHORT TERM GOALS: Target date: 04/03/24  Pt to verbalize understanding with visual scanning strategies and activities to encourage visual scanning Baseline: Goal status:  MET  2.  Pt independent with high level coordination HEP  Baseline:  04/21/2024: has not been completing Goal status: MET  3.  Pt to perform tabletop scanning at 90% accuracy Baseline:  04/21/2024: 97% accuracy Goal status: MET   LONG TERM GOALS: Target date: 06/13/2024    Pt to perform environmental scanning at 85% accuracy or higher with simple physical task Baseline:  Goal status: IN PROGRESS  2.  Pt to sufficiently type for work related tasks including entering/translating correct info on the computer Baseline: used Dragon the last 5+ years Goal status: IN PROGRESS   3.  Pt to demo ability to perform problem solving task for work simulation w/ task modifications/external cues prn with no verbal cues Baseline:  Goal status: IN PROGRESS  4.  Improve coordination as evidenced by performing 9 hole peg test in 25 sec or less Baseline: b/t 28 and 36 sec.  04/21/2024: 26 seconds bilaterally Goal status: IN PROGRESS   ASSESSMENT:  CLINICAL IMPRESSION:  Pt progressing well towards goals, meeting his remaining STG, completing HEPs more consistently outside of the clinic. Additional skilled OT services might be limited to insurance VL. Will monitor and d/c appropriately.   PERFORMANCE DEFICITS: in functional skills including IADLs, coordination, Fine motor control, decreased knowledge of use of DME, and vision, cognitive skills including memory, perception, problem solving, and safety awareness.   IMPAIRMENTS: are limiting patient from IADLs, work, and leisure.   CO-MORBIDITIES: may have co-morbidities  that affects occupational performance. Patient will benefit from skilled OT to address above impairments and improve overall function.  EVALUATION COMPLEXITY: Moderate  PLAN:  OT FREQUENCY: 2x/week  OT DURATION: 6 weeks  PLANNED INTERVENTIONS: 97535 self care/ADL training, 02889 therapeutic exercise, 97530 therapeutic activity, 97112 neuromuscular re-education, [id number] Cognitive  training (first 15 min), 02869 Cognitive training(each additional 15 min), visual/perceptual remediation/compensation, patient/family education, and DME and/or AE instructions  RECOMMENDED OTHER SERVICES: Speech therapy services  CONSULTED AND AGREED WITH PLAN OF CARE: Patient and family member/caregiver  PLAN FOR NEXT SESSION: update on VL?; recheck goals  Jocelyn CHRISTELLA Bottom, OT 05/19/2024, 10:35 AM

## 2024-05-26 ENCOUNTER — Ambulatory Visit: Admitting: Occupational Therapy

## 2024-06-02 ENCOUNTER — Ambulatory Visit: Admitting: Occupational Therapy

## 2024-06-08 DIAGNOSIS — K4090 Unilateral inguinal hernia, without obstruction or gangrene, not specified as recurrent: Secondary | ICD-10-CM | POA: Diagnosis not present

## 2024-06-08 DIAGNOSIS — R1030 Lower abdominal pain, unspecified: Secondary | ICD-10-CM | POA: Diagnosis not present

## 2024-06-09 ENCOUNTER — Ambulatory Visit: Admitting: Occupational Therapy

## 2024-06-11 ENCOUNTER — Ambulatory Visit: Admitting: Podiatry

## 2024-06-16 ENCOUNTER — Encounter: Admitting: Occupational Therapy

## 2024-06-18 ENCOUNTER — Encounter: Payer: Self-pay | Admitting: Podiatry

## 2024-06-18 ENCOUNTER — Ambulatory Visit: Admitting: Podiatry

## 2024-06-18 DIAGNOSIS — M79674 Pain in right toe(s): Secondary | ICD-10-CM

## 2024-06-18 DIAGNOSIS — M79675 Pain in left toe(s): Secondary | ICD-10-CM

## 2024-06-18 DIAGNOSIS — B351 Tinea unguium: Secondary | ICD-10-CM | POA: Diagnosis not present

## 2024-06-18 NOTE — Patient Instructions (Addendum)
 Place 1/4 cup of epsom salts in a quart of warm tap water.  Submerge your foot or feet in the solution and soak for 20 minutes.  This soak should be done twice a day.  Next, remove your foot or feet from solution, blot dry the affected area. Apply ointment and cover if instructed by your doctor.  Discontinue antibiotic ointment after the first 5 to 7 days.  After that time, continue to soak and bandage the toe until a stable scab fully forms. As the healing progresses you can begin to leave the toe open to air around the house in clean environments to help the nail avulsion sites dry out  following the nail soaks.  IF YOUR SKIN BECOMES IRRITATED WHILE USING THESE INSTRUCTIONS, IT IS OKAY TO SWITCH TO  WHITE VINEGAR AND WATER.  As another alternative soak, you may use antibacterial soap and water.  Monitor for any signs/symptoms of infection. Call the office immediately if any occur or go directly to the emergency room. Call with any questions/concerns.

## 2024-06-18 NOTE — Progress Notes (Signed)
 Subjective:  Patient ID: Dakota Flores, male    DOB: 1960/09/06,  MRN: [id number]  Dakota Flores presents to clinic today for:  Chief Complaint  Patient presents with   Nail Problem    Bilateral nail avulsion. No diabetic no anti coag  Patient returns today to proceed with bilateral great toe total nail avulsions.  He has been dealing with mycosis of the toenails and has been unable to get it to resolve despite several rounds of treatment including prescription oral, various over-the-counter treatments.  The nail plates do cause some irritation.  PCP is Cristopher Bottcher, NP.  Allergies  Allergen Reactions   Diclofenac Sodium Dermatitis    Review of Systems: Negative except as noted in the HPI.  Objective:  There were no vitals filed for this visit.  Dakota Flores is a pleasant 63 y.o. male in NAD. AAO x 3.  Vascular Examination: Capillary refill time is less than 3 seconds to toes bilateral. Palpable pedal pulses b/l LE. Digital hair present b/l. No pedal edema b/l. Skin temperature gradient WNL b/l. No varicosities b/l. No cyanosis or clubbing noted b/l.   Dermatological Examination: The bilateral first toenails dystrophic mycotic appearing.  They are tender on direct dorsal palpation.  Mild irritation noted about the nail borders with minimal erythema.  Neurological Examination: Protective sensation intact with Semmes-Weinstein 10 gram monofilament b/l LE. Vibratory sensation intact b/l LE.      No data to display           Assessment/Plan: 1. Pain due to onychomycosis of toenails of both feet     No orders of the defined types were placed in this encounter.  # Pain due to onychomycosis of toenails - Bilateral first toenails most affected - Chronic in nature - Patient has failed of therapy including prolonged course of oral medication from outside provider - Requesting permanent removal of the affected toenails. - Risks, benefits and alternative  therapies were discussed with the patient at length. He expressed good understanding and is electing to proceed with bilateral total nail avulsion with chemical matrixectomy.  Written consent obtained  Procedure: Bilateral first toe total nail avulsion with chemical matrixectomy Discussed patient's condition today.  After obtaining written patient consent, the bilateral first toes were anesthetized with a 50:50 mixture of 1% lidocaine plain and 0.5% bupivacaine plain for a total of 3cc's administered. Betadine skin prep performed. Upon confirmation of anesthesia, a freer elevator was utilized to free the bilateral hallux nail plate from the nail bed.  The nail plates were then avulsed proximal to the eponychium and removed in toto.  The area was inspected for any remaining spicules.  A chemical matrixectomy was performed with Phenol and neutralized with isopropyl alcohol solution.  Antibiotic ointment and a DSD were applied, followed by a Coban dressing.  Patient tolerated the anesthetic and procedure well and will f/u in 2-3 weeks for recheck.  Patient given post-procedure instructions for daily 20-minute Epsom salt soaks, antibiotic ointment and daily use of Bandaids until toe starts to dry / form eschar.    Return in about 2 weeks (around 07/02/2024) for Nail Check.   Ethan Saddler, DPM, AACFAS [facility]     [address]  [place], [place] [address]                Office [phone]  Fax [phone]

## 2024-06-22 ENCOUNTER — Ambulatory Visit: Payer: Self-pay | Admitting: Occupational Therapy

## 2024-06-23 DIAGNOSIS — R351 Nocturia: Secondary | ICD-10-CM | POA: Diagnosis not present

## 2024-06-23 DIAGNOSIS — R3915 Urgency of urination: Secondary | ICD-10-CM | POA: Diagnosis not present

## 2024-06-23 DIAGNOSIS — N401 Enlarged prostate with lower urinary tract symptoms: Secondary | ICD-10-CM | POA: Diagnosis not present

## 2024-06-26 ENCOUNTER — Ambulatory Visit: Payer: Self-pay | Admitting: Surgery

## 2024-06-26 DIAGNOSIS — K4020 Bilateral inguinal hernia, without obstruction or gangrene, not specified as recurrent: Secondary | ICD-10-CM | POA: Diagnosis not present

## 2024-06-26 NOTE — H&P (Signed)
 Subjective    Chief Complaint: Inguinal Hernia       History of Present Illness: Dakota Flores is a 63 y.o. male who is seen today as an office consultation at the request of Dr. Laurice for evaluation of Inguinal Hernia .   This is a healthy 63 year old male who presents after recent discovery of a bulge in his left groin.  The patient is quite active and plays a lot of tennis.  After playing tennis 1 day recently, he noticed some discomfort and a bulge in his left groin.  He brought this to the attention of his primary care provider who diagnosed bilateral inguinal hernias.  He has minimal symptoms on the right side.  The patient has had a previous vasectomy.   In June of this year, the patient suffered an intracranial hemorrhage and was diagnosed with an acute arteriovenous fistula.  He required emergent intervention by neurosurgery at [facility].  Dr. Fargen.  He has been doing quite well and was released from neurosurgery care.   Patient denies any GI obstructive symptoms.  He continues to be able to play tennis but is wearing a hernia belt.  No previous abdominal surgery.   Review of Systems: A complete review of systems was obtained from the patient.  I have reviewed this information and discussed as appropriate with the patient.  See HPI as well for other ROS.   Review of Systems  Constitutional: Negative.   HENT: Negative.    Eyes: Negative.   Respiratory: Negative.    Cardiovascular: Negative.   Gastrointestinal: Negative.   Genitourinary: Negative.   Musculoskeletal: Negative.   Skin: Negative.   Neurological: Negative.   Endo/Heme/Allergies: Negative.   Psychiatric/Behavioral: Negative.          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        Problem List     Patient Active Problem List  Diagnosis   Non-recurrent  bilateral inguinal hernia without obstruction or gangrene   Angioma   Cerebrovascular dural AV fistula (HHS-HCC)   Erectile dysfunction   Disorder of function of stomach   GAD (generalized anxiety disorder)   History of squamous cell carcinoma in situ (SCCIS)   Moderate episode of recurrent major depressive disorder (CMS-HCC)   Seborrheic dermatitis        Past Surgical History       Past Surgical History:  Procedure Laterality Date   cerebrovascular dural AV fistula         2025        Allergies      Allergies  Allergen Reactions   Diclofenac Sodium Dermatitis and Rash        Medications Ordered Prior to Encounter  No current outpatient medications on file prior to visit.    No current facility-administered medications on file prior to visit.        Family History  History reviewed. No pertinent family history.      Tobacco Use History  Social History       Tobacco Use  Smoking Status Never  Smokeless Tobacco Never        Social History  Social History        Socioeconomic History   Marital status: Married  Tobacco Use   Smoking status: Never   Smokeless tobacco: Never  Substance and Sexual Activity   Drug use: Never  Social Drivers of Health        Food Insecurity: Unknown (02/17/2024)    Received from [facility]    Hunger Vital Sign     Within the past 12 months, you worried that your food would run out before you got money to buy more: Patient unable to answer     Within the past 12 months, the food you bought just didn't last and you didn't have money to get more. : Patient unable to answer  Transportation Needs: Unknown (02/17/2024)    Received from [employer]     In the past 12 months, has lack of reliable transportation kept you from medical appointments, meetings, work or from getting things needed for daily living? : Patient unable to answer  Physical Activity: Sufficiently Active (10/22/2018)    Received from  [facility]    Exercise Vital Sign     On average, how many days per week do you engage in moderate to strenuous exercise (like a brisk walk)?: 4 days     On average, how many minutes do you engage in exercise at this level?: 90 min  Housing Stability: Unknown (06/26/2024)    Housing Stability Vital Sign     Homeless in the Last Year: No        Objective:         Vitals:    06/26/24 0916  BP: (!) 146/87  Pulse: 67  Weight: 76.9 kg (169 lb 9.6 oz)  Height: 177.8 cm (5' 10)  PainSc: 0-No pain    Body mass index is 24.34 kg/m.   Physical Exam    Constitutional:  WDWN in NAD, conversant, no obvious deformities; lying in bed comfortably Eyes:  Pupils equal, round; sclera anicteric; moist conjunctiva; no lid lag HENT:  Oral mucosa moist; good dentition  Neck:  No masses palpated, trachea midline; no thyromegaly Lungs:  CTA bilaterally; normal respiratory effort Breasts:  symmetric, no nipple changes; no palpable masses or lymphadenopathy on either side CV:  Regular rate and rhythm; no murmurs; extremities well-perfused with no edema Abd:  +bowel sounds, soft, non-tender, no palpable organomegaly; no palpable hernias GU: Bilateral descended testes, no testicular masses, bilateral inguinal hernias that are both reducible. Musc: Normal gait; no apparent clubbing or cyanosis in extremities Lymphatic:  No palpable cervical or axillary lymphadenopathy Skin:  Warm, dry; no sign of jaundice Psychiatric - alert and oriented x 4; calm mood and affect       Assessment and Plan:  Diagnoses and all orders for this visit:   Non-recurrent bilateral inguinal hernia without obstruction or gangrene       Recommend open bilateral inguinal hernia repairs with mesh.  We discussed the options of open versus laparoscopic repair and have decided proceed with open repair.The surgical procedure has been discussed with the patient.  Potential risks, benefits, alternative treatments, and expected  outcomes have been explained.  All of the patient's questions at this time have been answered.  The likelihood of reaching the patient's treatment goal is good.  The patient understands the proposed surgical procedure and wishes to proceed.       DONNICE DEWAYNE LIMA, MD  06/26/2024 12:26 PM

## 2024-07-02 ENCOUNTER — Ambulatory Visit: Admitting: Podiatry

## 2024-07-02 ENCOUNTER — Ambulatory Visit: Payer: Self-pay | Attending: Physician Assistant | Admitting: Occupational Therapy

## 2024-07-02 DIAGNOSIS — R278 Other lack of coordination: Secondary | ICD-10-CM | POA: Insufficient documentation

## 2024-07-02 DIAGNOSIS — R41842 Visuospatial deficit: Secondary | ICD-10-CM | POA: Diagnosis not present

## 2024-07-02 DIAGNOSIS — I69118 Other symptoms and signs involving cognitive functions following nontraumatic intracerebral hemorrhage: Secondary | ICD-10-CM | POA: Diagnosis not present

## 2024-07-02 DIAGNOSIS — M6281 Muscle weakness (generalized): Secondary | ICD-10-CM | POA: Diagnosis not present

## 2024-07-02 NOTE — Therapy (Signed)
 OUTPATIENT OCCUPATIONAL THERAPY NEURO TREATMENT  Patient Name: Dakota Flores MRN: [id number] DOB:04-10-1961, 63 y.o., male Today's Date: 07/02/2024  PCP: Cristopher Bottcher, NP REFERRING PROVIDER: Valora Tawni BIRCH, PA-C  END OF SESSION:  OT End of Session - 07/02/24 0854     Visit Number 9    Number of Visits 19    Date for Recertification  08/26/24    Authorization Type BC/BS - 25 VL combined (used 4), no auth required    Authorization - Number of Visits 21    OT Start Time 0850    OT Stop Time 0933    OT Time Calculation (min) 43 min    Activity Tolerance Patient tolerated treatment well    Behavior During Therapy [facility] for tasks assessed/performed         Past Medical History:  Diagnosis Date   Depression    Past Surgical History:  Procedure Laterality Date   COLONOSCOPY  08/2017   LASIK  2005   VASECTOMY     WISDOM TOOTH EXTRACTION     There are no active problems to display for this patient.  ONSET DATE: 03/03/2024 (referral date)   REFERRING DIAG: I67.1 (ICD-10-CM) - Cerebral aneurysm, nonruptured  THERAPY DIAG:  Other lack of coordination  Visuospatial deficit  Other symptoms and signs involving cognitive functions following nontraumatic intracerebral hemorrhage  Muscle weakness (generalized)  Rationale for Evaluation and Treatment: Rehabilitation  SUBJECTIVE:   SUBJECTIVE STATEMENT: Pt reports he feels he is doing well overall noting he has learned to compensate for visual field loss significantly compared to eval. He questions if he will be able to resume driving and   Pt accompanied by: significant other  PERTINENT HISTORY:  63 y.o. male with PMH seizures (in 25s, stopped ASMs in the 1980s), HLD, hearing loss, no AP/AC who presents after collapsing while playing tennis with seizure activity, found to have a large left parietal occipital IPH with intraventricular hemorrhage and CTA concerning for underlying dural arteriovenous  fistula.  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 Yes: NPRS scale: 3/10 Pain location: L shoulder and neck Pain description: tightness Aggravating factors: increases over night Relieving factors: stretches  FALLS: Has patient fallen in last 6 months? No  LIVING ENVIRONMENT: Lives with: lives with their spouse Lives in: 1 level house, 3 steps to enter from front Has following equipment at home: None  PLOF: Independent, Leisure: tennis, home improvement, and works as pensions consultant for [employer]  PATIENT GOALS: return to normal vision and reading   OBJECTIVE:  Note: Objective measures were completed at Evaluation unless otherwise noted.  HAND DOMINANCE: Right  ADLs: Overall ADLs: independent Transfers/ambulation related to ADLs: independent Eating: independent Grooming: mod I  UB Dressing: independent LB Dressing: independent Toileting: independent Bathing: mod I  Tub Shower transfers: mod I  Equipment: none  IADLs: Shopping: has not yet done Light housekeeping: pt has returned to light cleaning Meal Prep: wife has always done Community mobility: currently unable to drive Medication management: independent Financial management: not yet attempted since d/c from hospital  Handwriting: no changes  MOBILITY STATUS: Independent  UPPER EXTREMITY ROM:  BUE AROM WNLs  UPPER EXTREMITY MMT:   BUE MMT grossly 5/5  HAND FUNCTION: Grip strength: Right: 103.1 lbs; Left: 105.3 lbs  COORDINATION: 9 Hole Peg test: Right: 36.48 sec initially (pt  kept pulling pegs back out after placing d/t ? Sweaty fingers), 2nd trial = 28.68 sec; Left: 25.04 sec  SENSATION: WFL  EDEMA: very mild Rt hand  COGNITION: Overall cognitive status: pt with max difficulty spelling world backwards/decreased processing and  significantly extra time, self corrected error. Pt also demo some mild difficulty following directions, but could also be due to [facility]. 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  Patient has difficulty with following activities due to following visual impairments: reading  PERCEPTION: Not tested  PRAXIS: Not tested  OBSERVATIONS: significant Rt homonymous hemianopsia to midline, higher level cognitive deficits                                                                                                                           TREATMENT:  - Self-care/home management completed for duration as noted below including: OT reviewed and provided education/recommendations with respect to use of virtual reality in addition to his other compensatory strategies following loss of visual field. OT specifically provided education about a [employer] study that is ongoing. He was encouraged to reach out to the research investigator for further details.  OT placed 5 BlazePods on mirror in front of patient and had patient tap pods with palm of hand as pods lit up using OT Distraction and Scanning mode forreaction time, scanning and locating of items, processing, bimanual coordination/trunk control, motor planning, item/pattern recognition, and endurance/stamina.   Hits were as follows:  RUE and LUE: 72 hits for 2 min duration and 1.4 sec reaction time with 2 strikes OT provided education on how this  activity incorporates visual scanning with reaction time and can be replicated at home.  OT educated pt and spouse on where he can get a full driving evaluation behind the wheel through [employer]. Information provided on how to contact this company to get an evaluation set up.    PATIENT EDUCATION: Education details:Incorporate visual scanning strategies into daily activities  Person educated: Patient and Spouse Education method: Explanation, Demonstration, and Verbal cues Education comprehension: verbalized understanding, returned demonstration, and verbal cues required  HOME EXERCISE PROGRAM: 03/17/24: High level coordination HEP, visual scanning strategies and activities 04/01/2024: golf solitaire 04/16/2024: Sleep hygiene  GOALS: Goals reviewed with patient? Yes  SHORT TERM GOALS: Target date: 04/03/24  Pt to verbalize understanding with visual scanning strategies and activities to encourage visual scanning Baseline: Goal status: MET  2.  Pt independent with high level coordination HEP  Baseline:  04/21/2024: has not been completing  Goal status: MET  3.  Pt to perform tabletop scanning at 90% accuracy Baseline:  04/21/2024: 97% accuracy Goal status: MET   LONG TERM GOALS: Target date: 08/26/2024    Pt to perform environmental scanning at 85% accuracy or higher with simple physical task Baseline:  Goal status: IN PROGRESS  2.  Pt to sufficiently type for work related tasks including entering/translating correct info on the computer Baseline: used Dragon the last 5+ years Goal status: DEFERRED  3.  Pt to demo ability to perform problem solving task for work simulation w/ task modifications/external cues prn with no verbal cues Baseline:  Goal status: IN PROGRESS  4.  Improve coordination as evidenced by performing 9 hole peg test in 25 sec or less Baseline: b/t 28 and 36 sec.  04/21/2024: 26 seconds bilaterally Goal status: IN PROGRESS   ASSESSMENT:  CLINICAL  IMPRESSION: Pt progressing well towards goals though continues to benefit from skilled OT services to improve independence and safety with ADL and IADL completion. Recommend 1 additional visit in 3 weeks to review items discussed today and to further assess remaining goals.   PERFORMANCE DEFICITS: in functional skills including IADLs, coordination, Fine motor control, decreased knowledge of use of DME, and vision, cognitive skills including memory, perception, problem solving, and safety awareness.   IMPAIRMENTS: are limiting patient from IADLs, work, and leisure.   CO-MORBIDITIES: may have co-morbidities  that affects occupational performance. Patient will benefit from skilled OT to address above impairments and improve overall function.  EVALUATION COMPLEXITY: Moderate  PLAN:  OT FREQUENCY: 1 additional visit  OT DURATION: in about 3 weeks  PLANNED INTERVENTIONS: 97535 self care/ADL training, 02889 therapeutic exercise, 97530 therapeutic activity, 97112 neuromuscular re-education, [id number] Cognitive training (first 15 min), 02869 Cognitive training(each additional 15 min), visual/perceptual remediation/compensation, patient/family education, and DME and/or AE instructions  RECOMMENDED OTHER SERVICES: Speech therapy services  CONSULTED AND AGREED WITH PLAN OF CARE: Patient and family member/caregiver  PLAN FOR NEXT SESSION: recheck goals - d/c?  Jocelyn CHRISTELLA Bottom, OT 07/02/2024, 5:38 PM

## 2024-07-07 DIAGNOSIS — H53461 Homonymous bilateral field defects, right side: Secondary | ICD-10-CM | POA: Diagnosis not present

## 2024-07-07 DIAGNOSIS — I671 Cerebral aneurysm, nonruptured: Secondary | ICD-10-CM | POA: Diagnosis not present

## 2024-07-28 ENCOUNTER — Ambulatory Visit: Attending: Physician Assistant | Admitting: Occupational Therapy

## 2024-07-28 DIAGNOSIS — D225 Melanocytic nevi of trunk: Secondary | ICD-10-CM | POA: Diagnosis not present

## 2024-07-28 DIAGNOSIS — R41842 Visuospatial deficit: Secondary | ICD-10-CM | POA: Diagnosis not present

## 2024-07-28 DIAGNOSIS — L814 Other melanin hyperpigmentation: Secondary | ICD-10-CM | POA: Diagnosis not present

## 2024-07-28 DIAGNOSIS — R278 Other lack of coordination: Secondary | ICD-10-CM | POA: Diagnosis not present

## 2024-07-28 DIAGNOSIS — I69118 Other symptoms and signs involving cognitive functions following nontraumatic intracerebral hemorrhage: Secondary | ICD-10-CM | POA: Diagnosis not present

## 2024-07-28 DIAGNOSIS — L821 Other seborrheic keratosis: Secondary | ICD-10-CM | POA: Diagnosis not present

## 2024-07-28 DIAGNOSIS — D0472 Carcinoma in situ of skin of left lower limb, including hip: Secondary | ICD-10-CM | POA: Diagnosis not present

## 2024-07-28 NOTE — Therapy (Signed)
 OUTPATIENT OCCUPATIONAL THERAPY NEURO TREATMENT and DISCHARGE  Patient Name: Dakota Flores MRN: [id number] DOB:01-Mar-1961, 63 y.o., male Today's Date: 07/28/2024  PCP: Cristopher Bottcher, NP REFERRING PROVIDER: Valora Tawni BIRCH, PA-C  END OF SESSION:  OT End of Session - 07/28/24 1156     Visit Number 10    Number of Visits 19    Date for Recertification  08/26/24    Authorization Type BC/BS - 25 VL combined (used 4), no auth required    Authorization - Number of Visits 21    OT Start Time 1155    OT Stop Time 1234    OT Time Calculation (min) 39 min    Activity Tolerance Patient tolerated treatment well    Behavior During Therapy WFL for tasks assessed/performed         Past Medical History:  Diagnosis Date   Depression    Past Surgical History:  Procedure Laterality Date   COLONOSCOPY  08/2017   LASIK  2005   VASECTOMY     WISDOM TOOTH EXTRACTION     There are no active problems to display for this patient.  ONSET DATE: 03/03/2024 (referral date)   REFERRING DIAG: I67.1 (ICD-10-CM) - Cerebral aneurysm, nonruptured  THERAPY DIAG:  Other lack of coordination  Visuospatial deficit  Other symptoms and signs involving cognitive functions following nontraumatic intracerebral hemorrhage  Rationale for Evaluation and Treatment: Rehabilitation  SUBJECTIVE:   SUBJECTIVE STATEMENT: Pt reports he was seen by neuro-ophthalmologist since last OT session. His last visual field testing showed good improvement with respect to L superior and R inferior regions of field cut. He was told he should not expect much additional improvement this far out from his CVA.   Pt accompanied by: significant other  PERTINENT HISTORY:  63 y.o. male with PMH seizures (in 40s, stopped ASMs in the 1980s), HLD, hearing loss, no AP/AC who presents after collapsing while playing tennis with seizure activity, found to have a large left parietal occipital IPH with intraventricular hemorrhage and  CTA concerning for underlying dural arteriovenous fistula.  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 Yes: NPRS scale: 3/10 Pain location: L shoulder and neck Pain description: tightness Aggravating factors: increases over night Relieving factors: stretches  FALLS: Has patient fallen in last 6 months? No  LIVING ENVIRONMENT: Lives with: lives with their spouse Lives in: 1 level house, 3 steps to enter from front Has following equipment at home: None  PLOF: Independent, Leisure: tennis, home improvement, and works as pensions consultant for [employer]  PATIENT GOALS: return to normal vision and reading   OBJECTIVE:  Note: Objective measures were completed at Evaluation unless otherwise noted.  HAND DOMINANCE: Right  ADLs: Overall ADLs: independent Transfers/ambulation related to ADLs: independent Eating: independent Grooming: mod I  UB Dressing: independent LB Dressing: independent Toileting: independent Bathing: mod I  Tub Shower transfers: mod I  Equipment: none  IADLs: Shopping: has not yet done Light housekeeping: pt has returned to light cleaning Meal Prep: wife has always done Community mobility: currently unable to drive Medication management: independent Financial management: not yet attempted since d/c from hospital  Handwriting: no changes  MOBILITY STATUS: Independent  UPPER EXTREMITY ROM:  BUE AROM WNLs  UPPER EXTREMITY MMT:   BUE MMT grossly 5/5  HAND FUNCTION: Grip strength: Right: 103.1 lbs; Left: 105.3 lbs  COORDINATION: 9 Hole Peg test: Right: 36.48 sec initially (pt kept pulling pegs back out after placing d/t ? Sweaty fingers), 2nd trial = 28.68 sec; Left: 25.04 sec  SENSATION: WFL  EDEMA: very mild Rt hand  COGNITION: Overall cognitive status: pt with max difficulty spelling  world backwards/decreased processing and significantly extra time, self corrected error. Pt also demo some mild difficulty following directions, but could also be due to [facility]. 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 Dual-tasking activities: Incorporated cognitive and motor tasks simultaneously to simulate real-life scenarios (e.g., responding to auditory cues while manipulating objects). Visual scanning and attention training: Increased visual and auditory stimuli to improve environmental awareness and safety during ADLs. Dynamic reaction training: Utilized Blazepods and Reaction Catch Stick to enhance reaction time, peripheral awareness, and hand-eye coordination for tasks such as catching objects or reaching for items during meal prep. Compensatory strategies: Educated patient on scanning techniques and  positioning strategies for home environment.   PATIENT EDUCATION: Education details:see above; OT d/c Person educated: Patient and Spouse Education method: Explanation, Demonstration, and Verbal cues Education comprehension: verbalized understanding, returned demonstration, and verbal cues required  HOME EXERCISE PROGRAM: 03/17/24: High level coordination HEP, visual scanning strategies and activities 04/01/2024: golf solitaire 04/16/2024: Sleep hygiene  GOALS: Goals reviewed with patient? Yes  SHORT TERM GOALS: MET 3/3   LONG TERM GOALS: Target date: 08/26/2024    Pt to perform environmental scanning at 85% accuracy or higher with simple physical task Baseline:  Goal status: IN PROGRESS  2.  Pt to sufficiently type for work related tasks including entering/translating correct info on the computer Baseline: used Dragon the last 5+ years Goal status: DEFERRED  3.  Pt to demo ability to perform problem solving task for work simulation w/ task modifications/external cues prn with no verbal cues Baseline:  Goal status: IN PROGRESS  4.  Improve coordination as evidenced by performing 9 hole peg test in 25 sec  or less Baseline: b/t 28 and 36 sec.  04/21/2024: 26 seconds bilaterally 07/28/2024: 25, 25, 29 sec = 26.3 second average Goal status: IN PROGRESS   ASSESSMENT:  CLINICAL IMPRESSION: Patient has participated in skilled occupational therapy services to address deficits related to right visual field cut, decreased visual scanning, and slowed reaction time impacting independence in self-care and home management tasks. Patient demonstrates improved ability to utilize compensatory strategies (e.g., visual scanning techniques) and increased accuracy during fine motor and dual-task activities. Despite progress, patient continues to exhibit mild difficulty with dynamic visual attention and reaction speed, which may impact safety in complex environments. Patient is appropriate for  discharge from OT at this time with recommendation for continued rehabilitation as able and use of learned strategies in daily routines. Education provided on home program and safety techniques.  PERFORMANCE DEFICITS: in functional skills including vision.   PLAN:  OT D/C Completed  CONSULTED AND AGREED WITH PLAN OF CARE: Patient and family member/caregiver  OCCUPATIONAL THERAPY DISCHARGE SUMMARY  Visits from Start of Care: 10  Current functional level related to goals / functional outcomes: Pt has met all STGs and has made progress towards all applicable LTGs.    Remaining deficits: R superior-lateral field cut   Education / Equipment: Continue with HEP and strategies following OT d/c to maximize function and maximize safety and overall level of independence.    Patient agrees to discharge. Patient goals were partially met. Patient is being discharged due to being pleased with the current functional level.SABRA Jocelyn CHRISTELLA Manny, OT 07/28/2024, 2:26 PM

## 2024-08-05 ENCOUNTER — Encounter (HOSPITAL_BASED_OUTPATIENT_CLINIC_OR_DEPARTMENT_OTHER): Payer: Self-pay | Admitting: Surgery

## 2024-08-05 NOTE — Progress Notes (Signed)
 Left message for Dakota Flores at [facility] about requested pre op neuro clearance not in EPIC as of today.

## 2024-08-06 ENCOUNTER — Encounter (HOSPITAL_BASED_OUTPATIENT_CLINIC_OR_DEPARTMENT_OTHER): Payer: Self-pay | Admitting: Surgery

## 2024-08-10 NOTE — H&P (Signed)
 Subjective    Chief Complaint: Inguinal Hernia       History of Present Illness: Dakota Flores is a 63 y.o. male who is seen today as an office consultation at the request of Dr. Laurice for evaluation of Inguinal Hernia .   This is a healthy 63 year old male who presents after recent discovery of a bulge in his left groin.  The patient is quite active and plays a lot of tennis.  After playing tennis 1 day recently, he noticed some discomfort and a bulge in his left groin.  He brought this to the attention of his primary care provider who diagnosed bilateral inguinal hernias.  He has minimal symptoms on the right side.  The patient has had a previous vasectomy.   In June of this year, the patient suffered an intracranial hemorrhage and was diagnosed with an acute arteriovenous fistula.  He required emergent intervention by neurosurgery at [facility]  Dr. Fargen.  He has been doing quite well and was released from neurosurgery care.   Patient denies any GI obstructive symptoms.  He continues to be able to play tennis but is wearing a hernia belt.  No previous abdominal surgery.   Review of Systems: A complete review of systems was obtained from the patient.  I have reviewed this information and discussed as appropriate with the patient.  See HPI as well for other ROS.   Review of Systems  Constitutional: Negative.   HENT: Negative.    Eyes: Negative.   Respiratory: Negative.    Cardiovascular: Negative.   Gastrointestinal: Negative.   Genitourinary: Negative.   Musculoskeletal: Negative.   Skin: Negative.   Neurological: Negative.   Endo/Heme/Allergies: Negative.   Psychiatric/Behavioral: Negative.          Medical History: Past Medical History         Past Medical History:  Diagnosis Date   Nontraumatic cortical hemorrhage of left cerebral hemisphere (CMS/HHS-HCC) 02/16/2024        Problem List       Patient Active Problem List  Diagnosis   Non-recurrent  bilateral inguinal hernia without obstruction or gangrene   Angioma   Cerebrovascular dural AV fistula (HHS-HCC)   Erectile dysfunction   Disorder of function of stomach   GAD (generalized anxiety disorder)   History of squamous cell carcinoma in situ (SCCIS)   Moderate episode of recurrent major depressive disorder (CMS-HCC)   Seborrheic dermatitis        Past Surgical History           Past Surgical History:  Procedure Laterality Date   cerebrovascular dural AV fistula         2025        Allergies         Allergies  Allergen Reactions   Diclofenac Sodium Dermatitis and Rash        Medications Ordered Prior to Encounter  No current outpatient medications on file prior to visit.    No current facility-administered medications on file prior to visit.        Family History  History reviewed. No pertinent family history.      Tobacco Use History  Social History         Tobacco Use  Smoking Status Never  Smokeless Tobacco Never        Social History  Social History           Socioeconomic History   Marital status: Married  Tobacco Use   Smoking  status: Never   Smokeless tobacco: Never  Substance and Sexual Activity   Drug use: Never    Social Drivers of Health           Food Insecurity: Unknown (02/17/2024)    Received from [facility]    Hunger Vital Sign     Within the past 12 months, you worried that your food would run out before you got money to buy more: Patient unable to answer     Within the past 12 months, the food you bought just didn't last and you didn't have money to get more. : Patient unable to answer  Transportation Needs: Unknown (02/17/2024)    Received from [facility]     In the past 12 months, has lack of reliable transportation kept you from medical appointments, meetings, work or from getting things needed for daily living? : Patient unable to answer  Physical Activity: Sufficiently Active (10/22/2018)     Received from [facility]    Exercise Vital Sign     On average, how many days per week do you engage in moderate to strenuous exercise (like a brisk walk)?: 4 days     On average, how many minutes do you engage in exercise at this level?: 90 min  Housing Stability: Unknown (06/26/2024)    Housing Stability Vital Sign     Homeless in the Last Year: No        Objective:           Vitals:    06/26/24 0916  BP: (!) 146/87  Pulse: 67  Weight: 76.9 kg (169 lb 9.6 oz)  Height: 177.8 cm (5' 10)  PainSc: 0-No pain    Body mass index is 24.34 kg/m.   Physical Exam    Constitutional:  WDWN in NAD, conversant, no obvious deformities; lying in bed comfortably Eyes:  Pupils equal, round; sclera anicteric; moist conjunctiva; no lid lag HENT:  Oral mucosa moist; good dentition  Neck:  No masses palpated, trachea midline; no thyromegaly Lungs:  CTA bilaterally; normal respiratory effort Breasts:  symmetric, no nipple changes; no palpable masses or lymphadenopathy on either side CV:  Regular rate and rhythm; no murmurs; extremities well-perfused with no edema Abd:  +bowel sounds, soft, non-tender, no palpable organomegaly; no palpable hernias GU: Bilateral descended testes, no testicular masses, bilateral inguinal hernias that are both reducible. Musc: Normal gait; no apparent clubbing or cyanosis in extremities Lymphatic:  No palpable cervical or axillary lymphadenopathy Skin:  Warm, dry; no sign of jaundice Psychiatric - alert and oriented x 4; calm mood and affect       Assessment and Plan:  Diagnoses and all orders for this visit:   Non-recurrent bilateral inguinal hernia without obstruction or gangrene       Recommend open bilateral inguinal hernia repairs with mesh.  We discussed the options of open versus laparoscopic repair and have decided proceed with open repair.The surgical procedure has been discussed with the patient.  Potential risks, benefits, alternative  treatments, and expected outcomes have been explained.  All of the patient's questions at this time have been answered.  The likelihood of reaching the patient's treatment goal is good.  The patient understands the proposed surgical procedure and wishes to proceed.     Donnice POUR. Belinda, MD, [facility]  General Surgery   08/10/2024 9:13 AM

## 2024-08-11 NOTE — Progress Notes (Signed)
 Clearance note from neurology reviewed with Dr Dorethea and he is OK with patient having IHR at [facility].

## 2024-08-12 ENCOUNTER — Ambulatory Visit (HOSPITAL_BASED_OUTPATIENT_CLINIC_OR_DEPARTMENT_OTHER): Admission: RE | Admit: 2024-08-12 | Discharge: 2024-08-12 | Disposition: A | Attending: Surgery | Admitting: Surgery

## 2024-08-12 ENCOUNTER — Ambulatory Visit (HOSPITAL_BASED_OUTPATIENT_CLINIC_OR_DEPARTMENT_OTHER): Payer: Self-pay | Admitting: Anesthesiology

## 2024-08-12 ENCOUNTER — Encounter (HOSPITAL_BASED_OUTPATIENT_CLINIC_OR_DEPARTMENT_OTHER): Payer: Self-pay | Admitting: Surgery

## 2024-08-12 ENCOUNTER — Encounter (HOSPITAL_BASED_OUTPATIENT_CLINIC_OR_DEPARTMENT_OTHER): Payer: Self-pay | Admitting: Anesthesiology

## 2024-08-12 ENCOUNTER — Encounter (HOSPITAL_BASED_OUTPATIENT_CLINIC_OR_DEPARTMENT_OTHER): Admission: RE | Disposition: A | Payer: Self-pay | Attending: Surgery

## 2024-08-12 ENCOUNTER — Other Ambulatory Visit: Payer: Self-pay

## 2024-08-12 DIAGNOSIS — K4020 Bilateral inguinal hernia, without obstruction or gangrene, not specified as recurrent: Secondary | ICD-10-CM | POA: Diagnosis not present

## 2024-08-12 DIAGNOSIS — G8918 Other acute postprocedural pain: Secondary | ICD-10-CM | POA: Diagnosis not present

## 2024-08-12 DIAGNOSIS — Z01818 Encounter for other preprocedural examination: Secondary | ICD-10-CM

## 2024-08-12 DIAGNOSIS — Z9852 Vasectomy status: Secondary | ICD-10-CM | POA: Diagnosis not present

## 2024-08-12 HISTORY — PX: INGUINAL HERNIA REPAIR: SHX194

## 2024-08-12 SURGERY — REPAIR, HERNIA, INGUINAL, ADULT
Anesthesia: General | Site: Groin | Laterality: Bilateral

## 2024-08-12 MED ORDER — BUPIVACAINE-EPINEPHRINE (PF) 0.5% -1:200000 IJ SOLN
INTRAMUSCULAR | Status: DC | PRN
  Administered 2024-08-12 (×2): 15 mL

## 2024-08-12 MED ORDER — LACTATED RINGERS IV SOLN: INTRAVENOUS | Status: DC

## 2024-08-12 MED ORDER — ACETAMINOPHEN 500 MG PO TABS
ORAL_TABLET | ORAL | Status: AC
  Filled 2024-08-12: qty 2

## 2024-08-12 MED ORDER — KETOROLAC TROMETHAMINE 30 MG/ML IJ SOLN
INTRAMUSCULAR | Status: AC
  Filled 2024-08-12: qty 1

## 2024-08-12 MED ORDER — LIDOCAINE 2% (20 MG/ML) 5 ML SYRINGE
INTRAMUSCULAR | Status: AC
  Filled 2024-08-12: qty 5

## 2024-08-12 MED ORDER — PROPOFOL 10 MG/ML IV BOLUS
INTRAVENOUS | Status: AC
  Filled 2024-08-12: qty 20

## 2024-08-12 MED ORDER — PHENYLEPHRINE 80 MCG/ML (10ML) SYRINGE FOR IV PUSH (FOR BLOOD PRESSURE SUPPORT)
PREFILLED_SYRINGE | INTRAVENOUS | Status: AC
  Filled 2024-08-12: qty 10

## 2024-08-12 MED ORDER — PHENYLEPHRINE HCL (PRESSORS) 10 MG/ML IV SOLN
INTRAVENOUS | Status: DC | PRN
  Administered 2024-08-12: 13:00:00 80 ug via INTRAVENOUS

## 2024-08-12 MED ORDER — MIDAZOLAM HCL 2 MG/2ML IJ SOLN
INTRAMUSCULAR | Status: AC
  Filled 2024-08-12: qty 2

## 2024-08-12 MED ORDER — BUPIVACAINE-EPINEPHRINE 0.25% -1:200000 IJ SOLN
INTRAMUSCULAR | Status: DC | PRN
  Administered 2024-08-12: 14:00:00 10 mL

## 2024-08-12 MED ORDER — GLYCOPYRROLATE 0.2 MG/ML IJ SOLN
INTRAMUSCULAR | Status: DC | PRN
  Administered 2024-08-12 (×2): .1 mg via INTRAVENOUS

## 2024-08-12 MED ORDER — KETOROLAC TROMETHAMINE 30 MG/ML IJ SOLN
INTRAMUSCULAR | Status: DC | PRN
  Administered 2024-08-12: 14:00:00 30 mg via INTRAVENOUS

## 2024-08-12 MED ORDER — CHLORHEXIDINE GLUCONATE CLOTH 2 % EX PADS
6.0000 | MEDICATED_PAD | Freq: Once | CUTANEOUS | Status: AC
  Administered 2024-08-12: 11:00:00 6 via TOPICAL

## 2024-08-12 MED ORDER — DEXAMETHASONE SOD PHOSPHATE PF 10 MG/ML IJ SOLN
INTRAMUSCULAR | Status: DC | PRN
  Administered 2024-08-12: 12:00:00 4 mg via INTRAVENOUS

## 2024-08-12 MED ORDER — MIDAZOLAM HCL (PF) 2 MG/2ML IJ SOLN
2.0000 mg | Freq: Once | INTRAMUSCULAR | Status: AC
  Administered 2024-08-12: 11:00:00 2 mg via INTRAVENOUS

## 2024-08-12 MED ORDER — BUPIVACAINE LIPOSOME 1.3 % IJ SUSP
INTRAMUSCULAR | Status: DC | PRN
  Administered 2024-08-12 (×2): 5 mL

## 2024-08-12 MED ORDER — EPHEDRINE SULFATE (PRESSORS) 25 MG/5ML IV SOSY
PREFILLED_SYRINGE | INTRAVENOUS | Status: DC | PRN
  Administered 2024-08-12: 12:00:00 10 mg via INTRAVENOUS
  Administered 2024-08-12 (×3): 5 mg via INTRAVENOUS

## 2024-08-12 MED ORDER — DEXMEDETOMIDINE HCL IN NACL 200 MCG/50ML IV SOLN
INTRAVENOUS | Status: DC | PRN
  Administered 2024-08-12: 12:00:00 8 ug via INTRAVENOUS

## 2024-08-12 MED ORDER — DEXMEDETOMIDINE HCL IN NACL 80 MCG/20ML IV SOLN
INTRAVENOUS | Status: AC
  Filled 2024-08-12: qty 20

## 2024-08-12 MED ORDER — 0.9 % SODIUM CHLORIDE (POUR BTL) OPTIME
TOPICAL | Status: DC | PRN
  Administered 2024-08-12: 12:00:00 1000 mL

## 2024-08-12 MED ORDER — ONDANSETRON HCL 4 MG/2ML IJ SOLN
INTRAMUSCULAR | Status: AC
  Filled 2024-08-12: qty 2

## 2024-08-12 MED ORDER — CEFAZOLIN SODIUM-DEXTROSE 2-4 GM/100ML-% IV SOLN
INTRAVENOUS | Status: AC
  Filled 2024-08-12: qty 100

## 2024-08-12 MED ORDER — ONDANSETRON HCL 4 MG/2ML IJ SOLN
INTRAMUSCULAR | Status: DC | PRN
  Administered 2024-08-12: 12:00:00 4 mg via INTRAVENOUS

## 2024-08-12 MED ORDER — FENTANYL CITRATE (PF) 100 MCG/2ML IJ SOLN
INTRAMUSCULAR | Status: AC
  Filled 2024-08-12: qty 2

## 2024-08-12 MED ORDER — ACETAMINOPHEN 500 MG PO TABS: 1000.0000 mg | ORAL_TABLET | ORAL | Status: AC

## 2024-08-12 MED ORDER — ACETAMINOPHEN 500 MG PO TABS
1000.0000 mg | ORAL_TABLET | Freq: Once | ORAL | Status: AC
  Administered 2024-08-12: 11:00:00 1000 mg via ORAL

## 2024-08-12 MED ORDER — CHLORHEXIDINE GLUCONATE CLOTH 2 % EX PADS: 6.0000 | MEDICATED_PAD | Freq: Once | CUTANEOUS | Status: DC

## 2024-08-12 MED ORDER — BUPIVACAINE-EPINEPHRINE (PF) 0.25% -1:200000 IJ SOLN
INTRAMUSCULAR | Status: AC
  Filled 2024-08-12: qty 30

## 2024-08-12 MED ORDER — OXYCODONE HCL 5 MG PO TABS: 5.0000 mg | ORAL_TABLET | Freq: Four times a day (QID) | ORAL | 0 refills | Status: AC | PRN

## 2024-08-12 MED ORDER — HYDROMORPHONE HCL 1 MG/ML IJ SOLN: 0.2500 mg | INTRAMUSCULAR | Status: DC | PRN

## 2024-08-12 MED ORDER — CEFAZOLIN SODIUM-DEXTROSE 2-4 GM/100ML-% IV SOLN
2.0000 g | INTRAVENOUS | Status: AC
  Administered 2024-08-12: 12:00:00 2 g via INTRAVENOUS

## 2024-08-12 MED ORDER — FENTANYL CITRATE (PF) 100 MCG/2ML IJ SOLN
100.0000 ug | Freq: Once | INTRAMUSCULAR | Status: AC
  Administered 2024-08-12: 11:00:00 100 ug via INTRAVENOUS

## 2024-08-12 MED ORDER — PROPOFOL 10 MG/ML IV BOLUS
INTRAVENOUS | Status: DC | PRN
  Administered 2024-08-12: 12:00:00 20 mg via INTRAVENOUS
  Administered 2024-08-12: 12:00:00 170 mg via INTRAVENOUS

## 2024-08-12 MED ADMIN — Fentanyl Citrate Preservative Free (PF) Inj 100 MCG/2ML: 50 ug | INTRAVENOUS | @ 12:00:00 | NDC 72572017025

## 2024-08-12 MED ADMIN — Lidocaine HCl(Cardiac) IV PF Soln Pref Syr 100 MG/5ML (2%): 5 mg | INTRAVENOUS | @ 12:00:00 | NDC 00409132305

## 2024-08-12 MED ADMIN — Fentanyl Citrate Preservative Free (PF) Inj 100 MCG/2ML: 25 ug | INTRAVENOUS | @ 12:00:00 | NDC 72572017025

## 2024-08-12 MED FILL — Fentanyl Citrate Preservative Free (PF) Inj 100 MCG/2ML: INTRAMUSCULAR | Qty: 2 | Status: AC

## 2024-08-12 SURGICAL SUPPLY — 40 items
BENZOIN TINCTURE PRP APPL 2/3 (GAUZE/BANDAGES/DRESSINGS) ×1 IMPLANT
BLADE CLIPPER SURG (BLADE) IMPLANT
BLADE HEX COATED 2.75 (ELECTRODE) ×1 IMPLANT
BLADE SURG 15 STRL LF DISP TIS (BLADE) ×1 IMPLANT
CHLORAPREP W/TINT 26 (MISCELLANEOUS) ×1 IMPLANT
COVER BACK TABLE 60X90IN (DRAPES) ×1 IMPLANT
COVER MAYO STAND STRL (DRAPES) ×1 IMPLANT
DRAIN PENROSE .5X12 LATEX STL (DRAIN) ×1 IMPLANT
DRAPE LAPAROTOMY TRNSV 102X78 (DRAPES) ×1 IMPLANT
DRAPE UTILITY XL STRL (DRAPES) ×1 IMPLANT
DRSG TEGADERM 4X4.75 (GAUZE/BANDAGES/DRESSINGS) ×1 IMPLANT
ELECTRODE REM PT RTRN 9FT ADLT (ELECTROSURGICAL) ×1 IMPLANT
GAUZE 4X4 16PLY ~~LOC~~+RFID DBL (SPONGE) ×1 IMPLANT
GAUZE SPONGE 4X4 12PLY STRL (GAUZE/BANDAGES/DRESSINGS) ×1 IMPLANT
GAUZE SPONGE 4X4 12PLY STRL LF (GAUZE/BANDAGES/DRESSINGS) ×1 IMPLANT
GLOVE BIO SURGEON STRL SZ7 (GLOVE) ×1 IMPLANT
GLOVE BIOGEL PI IND STRL 7.5 (GLOVE) ×1 IMPLANT
GOWN STRL REUS W/ TWL LRG LVL3 (GOWN DISPOSABLE) ×2 IMPLANT
MESH PARIETEX PROGRIP LEFT (Mesh General) IMPLANT
MESH PARIETEX PROGRIP RIGHT (Mesh General) IMPLANT
NDL HYPO 25X1 1.5 SAFETY (NEEDLE) ×1 IMPLANT
NEEDLE HYPO 25X1 1.5 SAFETY (NEEDLE) ×1 IMPLANT
PACK BASIN DAY SURGERY FS (CUSTOM PROCEDURE TRAY) ×1 IMPLANT
PENCIL SMOKE EVACUATOR (MISCELLANEOUS) ×1 IMPLANT
SLEEVE SCD COMPRESS KNEE MED (STOCKING) ×1 IMPLANT
SOLN 0.9% NACL POUR BTL 1000ML (IV SOLUTION) IMPLANT
SPIKE FLUID TRANSFER (MISCELLANEOUS) ×1 IMPLANT
SPONGE INTESTINAL PEANUT (DISPOSABLE) ×1 IMPLANT
STRIP CLOSURE SKIN 1/2X4 (GAUZE/BANDAGES/DRESSINGS) ×1 IMPLANT
SUT MNCRL AB 4-0 PS2 18 (SUTURE) ×1 IMPLANT
SUT SILK 2 0 SH (SUTURE) IMPLANT
SUT SILK 3-0 18XBRD TIE BLK (SUTURE) IMPLANT
SUT VIC AB 0 CT1 27XBRD ANBCTR (SUTURE) IMPLANT
SUT VIC AB 0 CT2 27 (SUTURE) ×1 IMPLANT
SUT VIC AB 2-0 SH 27XBRD (SUTURE) ×1 IMPLANT
SUT VIC AB 3-0 SH 27X BRD (SUTURE) ×1 IMPLANT
SYR CONTROL 10ML LL (SYRINGE) ×1 IMPLANT
TOWEL GREEN STERILE FF (TOWEL DISPOSABLE) ×1 IMPLANT
TUBE CONNECTING 20X1/4 (TUBING) IMPLANT
YANKAUER SUCT BULB TIP NO VENT (SUCTIONS) IMPLANT

## 2024-08-12 NOTE — Discharge Instructions (Addendum)
 [facility]  INGUINAL HERNIA REPAIR: POST OP INSTRUCTIONS  Always review your discharge instruction sheet given to you by the facility where your surgery was performed. IF YOU HAVE DISABILITY OR FAMILY LEAVE FORMS, YOU MUST BRING THEM TO THE OFFICE FOR PROCESSING.   DO NOT GIVE THEM TO YOUR DOCTOR.  1. A  prescription for pain medication may be given to you upon discharge.  Take your pain medication as prescribed, if needed.  If narcotic pain medicine is not needed, then you may take acetaminophen  (Tylenol ) or ibuprofen (Advil) as needed. 2. Take your usually prescribed medications unless otherwise directed. If you need a refill on your pain medication, please contact your pharmacy.  They will contact our office to request authorization. Prescriptions will not be filled after 5 pm or on week-ends. 3. You should follow a light diet the first 24 hours after arrival home, such as soup and crackers, etc.  Be sure to include lots of fluids daily.  Resume your normal diet the day after surgery. 4.Most patients will experience some swelling and bruising in the groin and scrotum.  Ice packs and reclining will help.  Swelling and bruising can take several days to resolve.  6. It is common to experience some constipation if taking pain medication after surgery.  Increasing fluid intake and taking a stool softener (such as Colace) will usually help or prevent this problem from occurring.  A mild laxative (Milk of Magnesia or Miralax) should be taken according to package directions if there are no bowel movements after 48 hours. 7. Unless discharge instructions indicate otherwise, you may remove your bandages 24-48 hours after surgery, and you may shower at that time.  You may have steri-strips (small skin tapes) in place directly over the incision.  These strips should be left on the skin for 7-10 days.  If your surgeon used skin glue on the incision, you may shower in 24 hours.  The  glue will flake off over the next 2-3 weeks.  Any sutures or staples will be removed at the office during your follow-up visit. 8. ACTIVITIES:  You may resume regular (light) daily activities beginning the next day--such as daily self-care, walking, climbing stairs--gradually increasing activities as tolerated.  You may have sexual intercourse when it is comfortable.  Refrain from any heavy lifting or straining until approved by your doctor.  a.You may drive when you are no longer taking prescription pain medication, you can comfortably wear a seatbelt, and you can safely maneuver your car and apply brakes. b.RETURN TO WORK:   _____________________________________________  9.You should see your doctor in the office for a follow-up appointment approximately 2-3 weeks after your surgery.  Make sure that you call for this appointment within a day or two after you arrive home to insure a convenient appointment time. 10.OTHER INSTRUCTIONS: _________________________    _____________________________________  WHEN TO CALL YOUR DOCTOR: Fever over 101.0 Inability to urinate Nausea and/or vomiting Extreme swelling or bruising Continued bleeding from incision. Increased pain, redness, or drainage from the incision  The clinic staff is available to answer your questions during regular business hours.  Please dont hesitate to call and ask to speak to one of the nurses for clinical concerns.  If you have a medical emergency, go to the nearest emergency room or call 911.  A surgeon from [facility] is always on call at the hospital   [address], [place], [place]  [address] ?  [id number], [place], [place]   [address] [phone] ? [phone] ? FAX [phone] Web site: [email or web address]  Post Anesthesia Home Care Instructions  Activity: Get plenty of rest for the remainder of the day. A responsible individual must stay with you for 24 hours following  the procedure.  For the next 24 hours, DO NOT: -Drive a car -Advertising copywriter -Drink alcoholic beverages -Take any medication unless instructed by your physician -Make any legal decisions or sign important papers.  Meals: Start with liquid foods such as gelatin or soup. Progress to regular foods as tolerated. Avoid greasy, spicy, heavy foods. If nausea and/or vomiting occur, drink only clear liquids until the nausea and/or vomiting subsides. Call your physician if vomiting continues.  Special Instructions/Symptoms: Your throat may feel dry or sore from the anesthesia or the breathing tube placed in your throat during surgery. If this causes discomfort, gargle with warm salt water. The discomfort should disappear within 24 hours.  If you had a scopolamine patch placed behind your ear for the management of post- operative nausea and/or vomiting:  1. The medication in the patch is effective for 72 hours, after which it should be removed.  Wrap patch in a tissue and discard in the trash. Wash hands thoroughly with soap and water. 2. You may remove the patch earlier than 72 hours if you experience unpleasant side effects which may include dry mouth, dizziness or visual disturbances. 3. Avoid touching the patch. Wash your hands with soap and water after contact with the patch.      Information for Discharge Teaching: EXPAREL  (bupivacaine  liposome injectable suspension)   Pain relief is important to your recovery. The goal is to control your pain so you can move easier and return to your normal activities as soon as possible after your procedure. Your physician may use several types of medicines to manage pain, swelling, and more.  Your surgeon or anesthesiologist gave you EXPAREL (bupivacaine ) to help control your pain after surgery.  EXPAREL  is a local anesthetic designed to release slowly over an extended period of time to provide pain relief by numbing the tissue around the surgical  site. EXPAREL  is designed to release pain medication over time and can control pain for up to 72 hours. Depending on how you respond to EXPAREL , you may require less pain medication during your recovery. EXPAREL  can help reduce or eliminate the need for opioids during the first few days after surgery when pain relief is needed the most. EXPAREL  is not an opioid and is not addictive. It does not cause sleepiness or sedation.   Important! A teal colored band has been placed on your arm with the date, time and amount of EXPAREL  you have received. Please leave this armband in place for the full 96 hours following administration, and then you may remove the band. If you return to the hospital for any reason within 96 hours following the administration of EXPAREL , the armband provides important information that your health care providers to know, and alerts them that you have received this anesthetic.    Possible side effects of EXPAREL : Temporary loss of sensation or ability to move in the area where medication was injected. Nausea, vomiting, constipation Rarely, numbness and tingling in your mouth or lips, lightheadedness, or anxiety may occur. Call your doctor right away if you think you may be experiencing any of these sensations, or if you have other questions regarding possible side effects.  Follow all other discharge instructions given to you  by your surgeon or nurse. Eat a healthy diet and drink plenty of water or other fluids.

## 2024-08-12 NOTE — Transfer of Care (Signed)
 Immediate Anesthesia Transfer of Care Note  Patient: Dakota Flores  Procedure(s) Performed: REPAIR, HERNIA, INGUINAL, ADULT (Bilateral: Groin)  Patient Location: PACU  Anesthesia Type:General  Level of Consciousness: oriented, drowsy, and patient cooperative  Airway & Oxygen Therapy: Patient Spontanous Breathing and Patient connected to face mask oxygen  Post-op Assessment: Report given to RN and Post -op Vital signs reviewed and stable  Post vital signs: Reviewed and stable  Last Vitals:  Vitals Value Taken Time  BP 135/79 08/12/24 13:53  Temp 36.4 C 08/12/24 13:53  Pulse 75 08/12/24 14:00  Resp 16 08/12/24 14:00  SpO2 100 % 08/12/24 14:00  Vitals shown include unfiled device data.  Last Pain:  Vitals:   08/12/24 1353  PainSc: 0-No pain         Complications: No notable events documented.

## 2024-08-12 NOTE — Op Note (Signed)
 Open bilateral inguinal hernia repair with mesh Operative Note  Indications: This is a healthy 63 year old male who presents after recent discovery of a bulge in his left groin. The patient is quite active and plays a lot of tennis. After playing tennis 1 day recently, he noticed some discomfort and a bulge in his left groin. He brought this to the attention of his primary care provider who diagnosed bilateral inguinal hernias. He has minimal symptoms on the right side. The patient has had a previous vasectomy.   Pre-operative Diagnosis: bilateral reducible inguinal hernia Post-operative Diagnosis: same  Surgeon: Dakota Flores   Assistants: none  Anesthesia: General LMA anesthesia and bilateral TAP blocks  ASA Class: 2  Procedure Details  The patient was seen again in the Holding Room. The risks, benefits, complications, treatment options, and expected outcomes were discussed with the patient. The possibilities of reaction to medication, pulmonary aspiration, perforation of viscus, bleeding, recurrent infection, the need for additional procedures, and development of a complication requiring transfusion or further operation were discussed with the patient and/or family. The likelihood of success in repairing the hernia and returning the patient to their previous functional status is good.  There was concurrence with the proposed plan, and informed consent was obtained. The site of surgery was properly noted/marked. The patient was taken to the Operating Room, identified as Dakota Flores, and the procedure verified as bilateral inguinal hernia repair. A Time Out was held and the above information confirmed.  The patient was placed in the supine position and underwent induction of anesthesia. The lower abdomen and groin was prepped with Chloraprep and draped in the standard fashion, and 0.25% Marcaine  with epinephrine  was used to anesthetize the skin over the mid-portion of the left inguinal  canal. An oblique incision was made. Dissection was carried down through the subcutaneous tissue with cautery to the external oblique fascia.  We opened the external oblique fascia along the direction of its fibers to the external ring.  The spermatic cord was circumferentially dissected bluntly and retracted with a Penrose drain.  The ilioinguinal nerve was identified and preserved.  The floor of the inguinal canal was inspected.  It was lax but there was no direct defect.  We skeletonized the spermatic cord and reduced a small indirect hernia sac.  We used a left Progrip mesh which was inserted and deployed across the floor of the inguinal canal. The mesh was tucked underneath the external oblique fascia laterally.  The flap of the mesh was closed around the spermatic cord to recreate the internal inguinal ring.  The mesh was secured to the pubic tubercle with 0 Vicryl.  Additional stay sutures were placed to attach the inferior edge of the mesh to the shelving edge and to close the mesh flap. The external oblique fascia was reapproximated with 2-0 Vicryl.  3-0 Vicryl was used to close the subcutaneous tissues and 4-0 Monocryl was used to close the skin in subcuticular fashion.    We turned our attention to the right side.  An oblique incision was made over the inguinal canal. Dissection was carried down through the subcutaneous tissue with cautery to the external oblique fascia.  We opened the external oblique fascia along the direction of its fibers to the external ring.  The spermatic cord was circumferentially dissected bluntly and retracted with a Penrose drain.  The ilioinguinal nerve was identified and preserved.  The floor of the inguinal canal was inspected and was quite lax.  We skeletonized  the spermatic cord and reduced a small indirect hernia sac.  We used a right Progrip mesh which was inserted and deployed across the floor of the inguinal canal. The mesh was tucked underneath the external oblique  fascia laterally.  The flap of the mesh was closed around the spermatic cord to recreate the internal inguinal ring.  The mesh was secured to the pubic tubercle with 0 Vicryl.  Additional stay sutures were placed to attach the inferior edge of the mesh to the shelving edge and to close the mesh flap. The external oblique fascia was reapproximated with 2-0 Vicryl.  3-0 Vicryl was used to close the subcutaneous tissues and 4-0 Monocryl was used to close the skin in subcuticular fashion.  Benzoin and steri-strips were used to seal the incisions.  A clean dressing was applied on each side.  The patient was then extubated and brought to the recovery room in stable condition.  All sponge, instrument, and needle counts were correct prior to closure and at the conclusion of the case.   Estimated Blood Loss: Minimal                 Complications: None; patient tolerated the procedure well.         Disposition: PACU - hemodynamically stable.         Condition: stable  Dakota POUR. Belinda, MD, [facility]  General Surgery   08/12/2024 1:52 PM

## 2024-08-12 NOTE — Anesthesia Procedure Notes (Signed)
 Procedure Name: LMA Insertion Date/Time: 08/12/2024 2:13 AM  Performed by: Kathern Rollene LABOR, CRNAPre-anesthesia Checklist: Patient identified, Emergency Drugs available, Suction available and Patient being monitored Patient Re-evaluated:Patient Re-evaluated prior to induction Oxygen Delivery Method: Circle system utilized Preoxygenation: Pre-oxygenation with 100% oxygen Induction Type: IV induction Ventilation: Mask ventilation without difficulty LMA: LMA inserted LMA Size: 4.0 Tube type: Oral Number of attempts: 1 Placement Confirmation: positive ETCO2 and breath sounds checked- equal and bilateral Tube secured with: Tape Dental Injury: Teeth and Oropharynx as per pre-operative assessment

## 2024-08-12 NOTE — Anesthesia Procedure Notes (Signed)
 Anesthesia Regional Block: TAP block   Pre-Anesthetic Checklist: , timeout performed,  Correct Patient, Correct Site, Correct Laterality,  Correct Procedure, Correct Position, site marked,  Risks and benefits discussed,  Pre-op evaluation,  At surgeon's request and post-op pain management  Laterality: Left and Right  Prep: Maximum Sterile Barrier Precautions used, chloraprep       Needles:  Injection technique: Single-shot  Needle Type: Echogenic Stimulator Needle     Needle Length: 9cm  Needle Gauge: 21     Additional Needles:   Procedures:,,,, ultrasound used (permanent image in chart),,    Narrative:  Start time: 08/12/2024 11:15 AM End time: 08/12/2024 11:25 AM Injection made incrementally with aspirations every 5 mL.  Performed by: Personally  Anesthesiologist: Epifanio Fallow, MD  Additional Notes: Bilateral TAP Blocks

## 2024-08-12 NOTE — Interval H&P Note (Signed)
 History and Physical Interval Note:  08/12/2024 10:37 AM  Dakota Flores  has presented today for surgery, with the diagnosis of BILATERAL INGUINAL HERNIAS.  The various methods of treatment have been discussed with the patient and family. After consideration of risks, benefits and other options for treatment, the patient has consented to  Procedures with comments: REPAIR, HERNIA, INGUINAL, ADULT (Bilateral) - BILATERAL INGUINAL HERNIA REAIR WITH MESH LMA/TAP BLOCK as a surgical intervention.  The patient's history has been reviewed, patient examined, no change in status, stable for surgery.  I have reviewed the patient's chart and labs.  Questions were answered to the patient's satisfaction.     Dakota Flores

## 2024-08-12 NOTE — Anesthesia Preprocedure Evaluation (Addendum)
 Anesthesia Evaluation  Patient identified by MRN, date of birth, ID band Patient awake    Reviewed: Allergy & Precautions, H&P , NPO status , Patient's Chart, lab work & pertinent test results  Airway Mallampati: II  TM Distance: >3 FB Neck ROM: Full    Dental no notable dental hx. (+) Teeth Intact, Dental Advisory Given   Pulmonary neg pulmonary ROS   Pulmonary exam normal breath sounds clear to auscultation       Cardiovascular negative cardio ROS  Rhythm:Regular Rate:Normal     Neuro/Psych negative neurological ROS  negative psych ROS   GI/Hepatic negative GI ROS, Neg liver ROS,,,  Endo/Other  negative endocrine ROS    Renal/GU negative Renal ROS  negative genitourinary   Musculoskeletal   Abdominal   Peds  Hematology negative hematology ROS (+)   Anesthesia Other Findings   Reproductive/Obstetrics negative OB ROS                              Anesthesia Physical Anesthesia Plan  ASA: 1  Anesthesia Plan: General   Post-op Pain Management: Tylenol  PO (pre-op)* and Regional block*   Induction: Intravenous  PONV Risk Score and Plan: 3 and Ondansetron , Dexamethasone  and Midazolam   Airway Management Planned: LMA  Additional Equipment:   Intra-op Plan:   Post-operative Plan: Extubation in OR  Informed Consent: I have reviewed the patients History and Physical, chart, labs and discussed the procedure including the risks, benefits and alternatives for the proposed anesthesia with the patient or authorized representative who has indicated his/her understanding and acceptance.     Dental advisory given  Plan Discussed with: CRNA  Anesthesia Plan Comments:          Anesthesia Quick Evaluation

## 2024-08-12 NOTE — Progress Notes (Signed)
AssistedDr. Edmond Fitzgerald with left, right, transabdominal plane, ultrasound guided Earley Grobe. Side rails up, monitors on throughout procedure. See vital signs in flow sheet. Tolerated Procedure well.  

## 2024-08-12 NOTE — Anesthesia Postprocedure Evaluation (Signed)
 Anesthesia Post Note  Patient: Dakota Flores  Procedure(s) Performed: REPAIR, HERNIA, INGUINAL, ADULT (Bilateral: Groin)     Patient location during evaluation: PACU Anesthesia Type: General and Regional Level of consciousness: awake and alert Pain management: pain level controlled Vital Signs Assessment: post-procedure vital signs reviewed and stable Respiratory status: spontaneous breathing, nonlabored ventilation and respiratory function stable Cardiovascular status: blood pressure returned to baseline and stable Postop Assessment: no apparent nausea or vomiting Anesthetic complications: no   No notable events documented.  Last Vitals:  Vitals:   08/12/24 1353 08/12/24 1400  BP: 135/79 121/81  Pulse: 76 75  Resp: 12 16  Temp: (!) 36.4 C   SpO2: 100% 100%    Last Pain:  Vitals:   08/12/24 1353  PainSc: 0-No pain                 Ruben Pyka,W. EDMOND
